# Patient Record
Sex: Female | Born: 1935
Health system: Southern US, Community
[De-identification: ages and names within clinical notes are randomized; demographics above are authoritative.]

## PROBLEM LIST (undated history)

## (undated) DIAGNOSIS — I639 Cerebral infarction, unspecified: Secondary | ICD-10-CM

## (undated) DIAGNOSIS — E039 Hypothyroidism, unspecified: Secondary | ICD-10-CM

## (undated) DIAGNOSIS — I4891 Unspecified atrial fibrillation: Secondary | ICD-10-CM

## (undated) DIAGNOSIS — I10 Essential (primary) hypertension: Secondary | ICD-10-CM

## (undated) HISTORY — DX: Cerebral infarction, unspecified: I63.9

## (undated) HISTORY — DX: Unspecified atrial fibrillation: I48.91

---

## 2000-12-18 ENCOUNTER — Ambulatory Visit (HOSPITAL_COMMUNITY): Admission: RE | Admit: 2000-12-18 | Discharge: 2000-12-18 | Payer: Self-pay | Admitting: Family Medicine

## 2000-12-18 ENCOUNTER — Encounter: Payer: Self-pay | Admitting: Family Medicine

## 2001-01-02 ENCOUNTER — Other Ambulatory Visit: Admission: RE | Admit: 2001-01-02 | Discharge: 2001-01-02 | Payer: Self-pay | Admitting: Family Medicine

## 2001-01-02 ENCOUNTER — Encounter: Payer: Self-pay | Admitting: Family Medicine

## 2001-01-02 ENCOUNTER — Ambulatory Visit (HOSPITAL_COMMUNITY): Admission: RE | Admit: 2001-01-02 | Discharge: 2001-01-02 | Payer: Self-pay | Admitting: Family Medicine

## 2001-08-13 ENCOUNTER — Ambulatory Visit (HOSPITAL_COMMUNITY): Admission: RE | Admit: 2001-08-13 | Discharge: 2001-08-13 | Payer: Self-pay | Admitting: Family Medicine

## 2001-08-13 ENCOUNTER — Encounter: Payer: Self-pay | Admitting: Family Medicine

## 2001-12-18 ENCOUNTER — Ambulatory Visit (HOSPITAL_COMMUNITY): Admission: RE | Admit: 2001-12-18 | Discharge: 2001-12-18 | Payer: Self-pay | Admitting: Family Medicine

## 2001-12-18 ENCOUNTER — Encounter: Payer: Self-pay | Admitting: Family Medicine

## 2002-02-20 ENCOUNTER — Encounter: Payer: Self-pay | Admitting: Gastroenterology

## 2002-02-20 ENCOUNTER — Ambulatory Visit (HOSPITAL_COMMUNITY): Admission: RE | Admit: 2002-02-20 | Discharge: 2002-02-20 | Payer: Self-pay | Admitting: Gastroenterology

## 2002-12-20 ENCOUNTER — Encounter: Payer: Self-pay | Admitting: Family Medicine

## 2002-12-20 ENCOUNTER — Ambulatory Visit (HOSPITAL_COMMUNITY): Admission: RE | Admit: 2002-12-20 | Discharge: 2002-12-20 | Payer: Self-pay | Admitting: Family Medicine

## 2003-01-24 ENCOUNTER — Ambulatory Visit (HOSPITAL_COMMUNITY): Admission: RE | Admit: 2003-01-24 | Discharge: 2003-01-24 | Payer: Self-pay | Admitting: Family Medicine

## 2003-01-24 ENCOUNTER — Encounter: Payer: Self-pay | Admitting: Family Medicine

## 2004-01-12 ENCOUNTER — Ambulatory Visit (HOSPITAL_COMMUNITY): Admission: RE | Admit: 2004-01-12 | Discharge: 2004-01-12 | Payer: Self-pay | Admitting: Family Medicine

## 2004-02-13 ENCOUNTER — Ambulatory Visit (HOSPITAL_COMMUNITY): Admission: RE | Admit: 2004-02-13 | Discharge: 2004-02-13 | Payer: Self-pay | Admitting: Family Medicine

## 2005-01-14 ENCOUNTER — Ambulatory Visit (HOSPITAL_COMMUNITY): Admission: RE | Admit: 2005-01-14 | Discharge: 2005-01-14 | Payer: Self-pay | Admitting: Family Medicine

## 2005-02-15 ENCOUNTER — Ambulatory Visit (HOSPITAL_COMMUNITY): Admission: RE | Admit: 2005-02-15 | Discharge: 2005-02-15 | Payer: Self-pay | Admitting: Family Medicine

## 2005-04-13 ENCOUNTER — Emergency Department (HOSPITAL_COMMUNITY): Admission: EM | Admit: 2005-04-13 | Discharge: 2005-04-13 | Payer: Self-pay | Admitting: Emergency Medicine

## 2006-01-16 ENCOUNTER — Ambulatory Visit (HOSPITAL_COMMUNITY): Admission: RE | Admit: 2006-01-16 | Discharge: 2006-01-16 | Payer: Self-pay | Admitting: Family Medicine

## 2006-04-14 ENCOUNTER — Ambulatory Visit (HOSPITAL_COMMUNITY): Admission: RE | Admit: 2006-04-14 | Discharge: 2006-04-14 | Payer: Self-pay | Admitting: Family Medicine

## 2006-04-21 ENCOUNTER — Ambulatory Visit (HOSPITAL_COMMUNITY): Admission: RE | Admit: 2006-04-21 | Discharge: 2006-04-21 | Payer: Self-pay | Admitting: Family Medicine

## 2006-05-22 ENCOUNTER — Ambulatory Visit (HOSPITAL_COMMUNITY): Admission: RE | Admit: 2006-05-22 | Discharge: 2006-05-22 | Payer: Self-pay | Admitting: Family Medicine

## 2006-06-01 ENCOUNTER — Emergency Department (HOSPITAL_COMMUNITY): Admission: EM | Admit: 2006-06-01 | Discharge: 2006-06-02 | Payer: Self-pay | Admitting: Emergency Medicine

## 2006-06-02 ENCOUNTER — Ambulatory Visit (HOSPITAL_COMMUNITY): Admission: RE | Admit: 2006-06-02 | Discharge: 2006-06-02 | Payer: Self-pay | Admitting: Emergency Medicine

## 2006-06-02 ENCOUNTER — Ambulatory Visit (HOSPITAL_COMMUNITY): Admission: RE | Admit: 2006-06-02 | Discharge: 2006-06-02 | Payer: Self-pay | Admitting: Family Medicine

## 2006-06-05 ENCOUNTER — Ambulatory Visit: Payer: Self-pay | Admitting: Orthopedic Surgery

## 2006-06-05 ENCOUNTER — Ambulatory Visit: Payer: Self-pay | Admitting: Urgent Care

## 2006-06-06 ENCOUNTER — Emergency Department (HOSPITAL_COMMUNITY): Admission: EM | Admit: 2006-06-06 | Discharge: 2006-06-07 | Payer: Self-pay | Admitting: Emergency Medicine

## 2006-06-07 ENCOUNTER — Ambulatory Visit (HOSPITAL_COMMUNITY): Admission: RE | Admit: 2006-06-07 | Discharge: 2006-06-07 | Payer: Self-pay | Admitting: Internal Medicine

## 2006-06-07 ENCOUNTER — Ambulatory Visit: Payer: Self-pay | Admitting: Gastroenterology

## 2006-06-07 ENCOUNTER — Inpatient Hospital Stay (HOSPITAL_COMMUNITY): Admission: AD | Admit: 2006-06-07 | Discharge: 2006-06-11 | Payer: Self-pay | Admitting: Internal Medicine

## 2006-07-03 ENCOUNTER — Ambulatory Visit: Payer: Self-pay | Admitting: Orthopedic Surgery

## 2006-09-22 ENCOUNTER — Emergency Department (HOSPITAL_COMMUNITY): Admission: EM | Admit: 2006-09-22 | Discharge: 2006-09-22 | Payer: Self-pay | Admitting: Emergency Medicine

## 2007-01-29 ENCOUNTER — Ambulatory Visit (HOSPITAL_COMMUNITY): Admission: RE | Admit: 2007-01-29 | Discharge: 2007-01-29 | Payer: Self-pay | Admitting: Family Medicine

## 2007-04-09 ENCOUNTER — Ambulatory Visit (HOSPITAL_COMMUNITY): Admission: RE | Admit: 2007-04-09 | Discharge: 2007-04-09 | Payer: Self-pay | Admitting: Family Medicine

## 2007-11-05 IMAGING — CT CT ABDOMEN W/ CM
2 of 5 series · 16 of 46 positions shown, 18 images · IV contrast (APPLIED)
Comparison: 06/02/2006

CLINICAL DATA: Perforated peptic ulcer disease

ABDOMEN CT WITH CONTRAST
TECHNIQUE: Multidetector CT imaging of the abdomen was performed following the
standard protocol during bolus administration of intravenous contrast.
Contrast:  100 cc Omnipaque 300

[Series 2: abd_pel 5.0 b40f · axial · 0.69mm/px · z∈[-290,-26]mm · 13 of 63 slices shown, 15 images]
[im 5/63  soft-tissue]
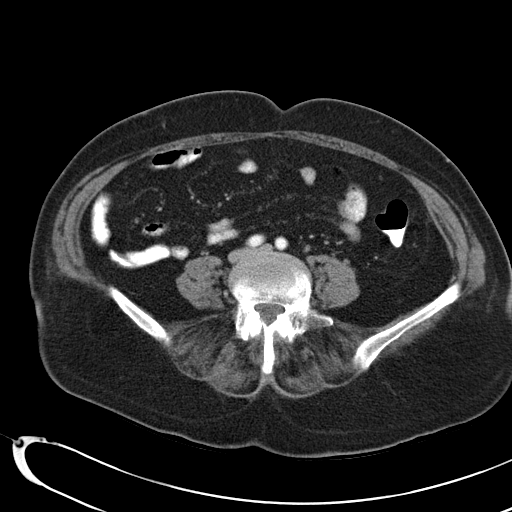
[im 5/63  bone]
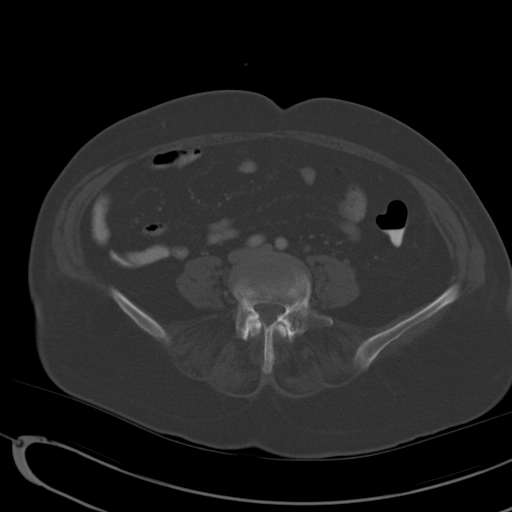
[im 9/63  soft-tissue]
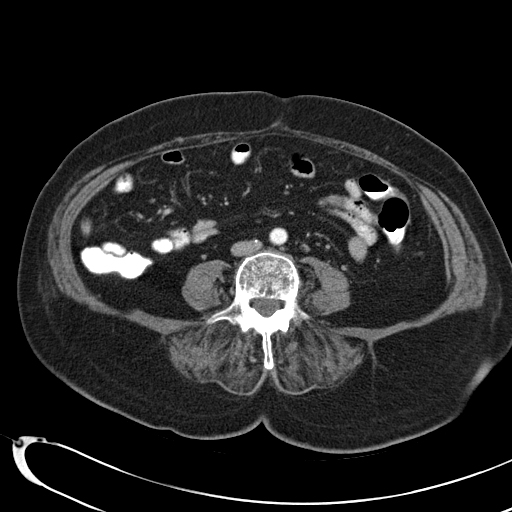
[im 14/63  soft-tissue]
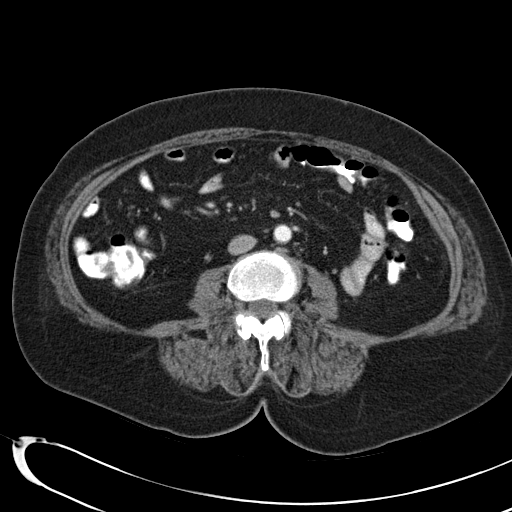
[im 18/63  soft-tissue]
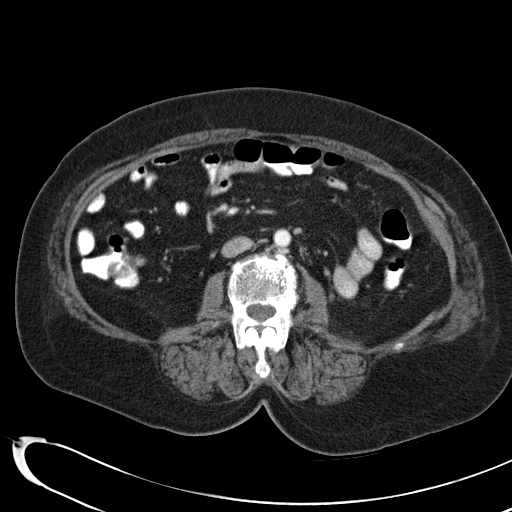
[im 23/63  soft-tissue]
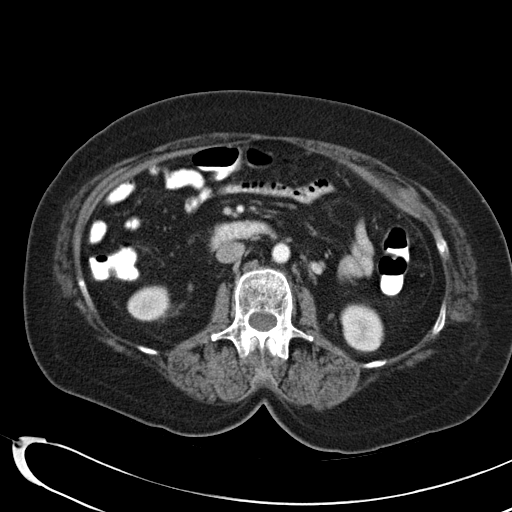
[im 27/63  soft-tissue]
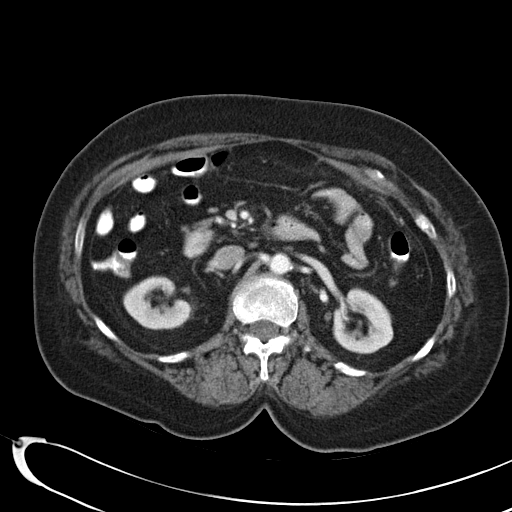
[im 32/63  soft-tissue]
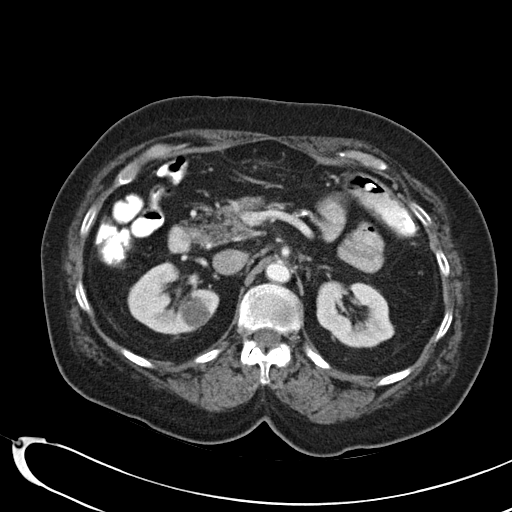
[im 36/63  soft-tissue]
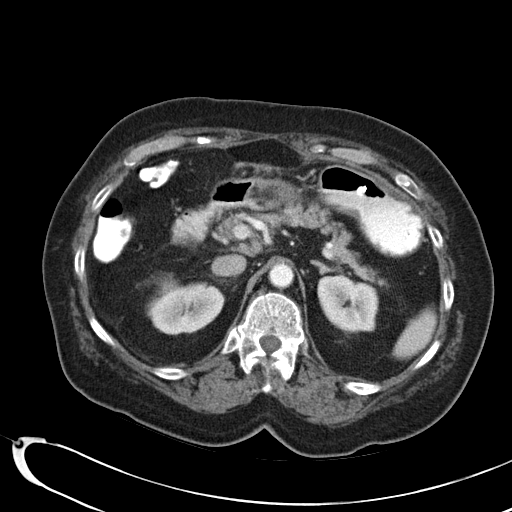
[im 40/63  soft-tissue]
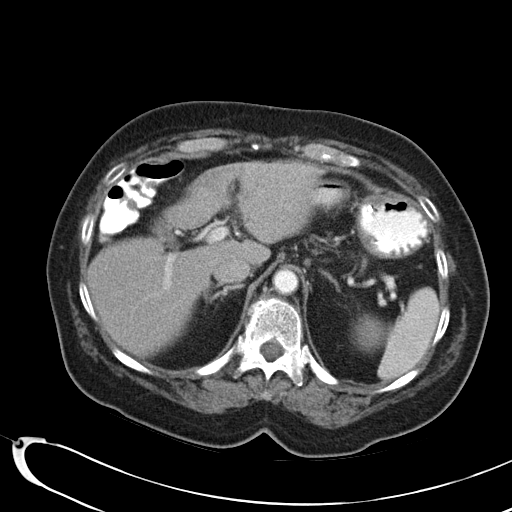
[im 40/63  bone]
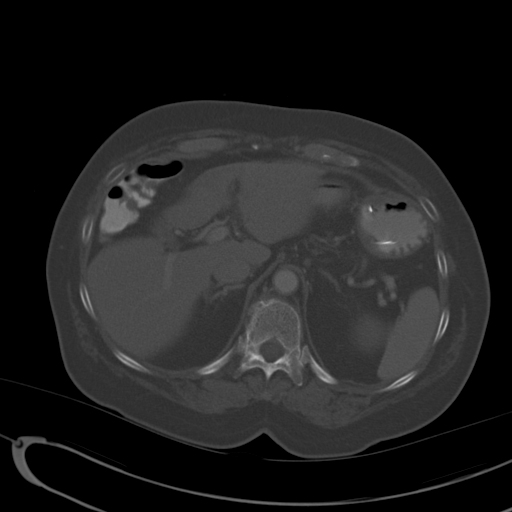
[im 45/63  soft-tissue]
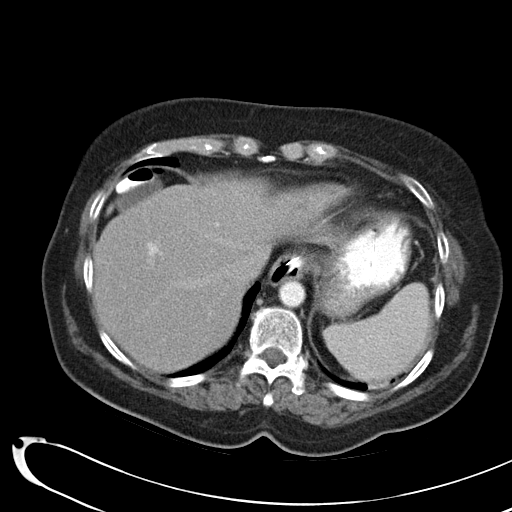
[im 49/63  soft-tissue]
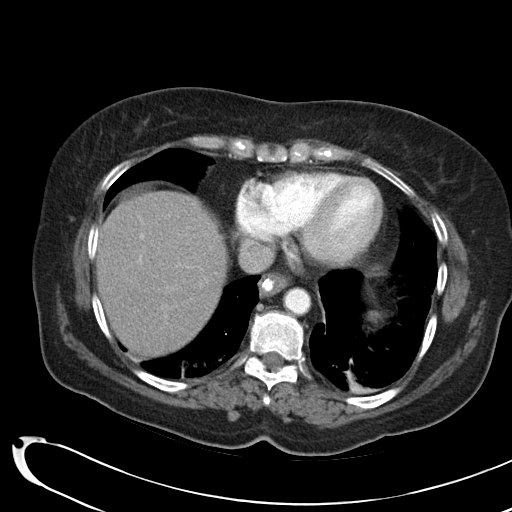
[im 54/63  soft-tissue]
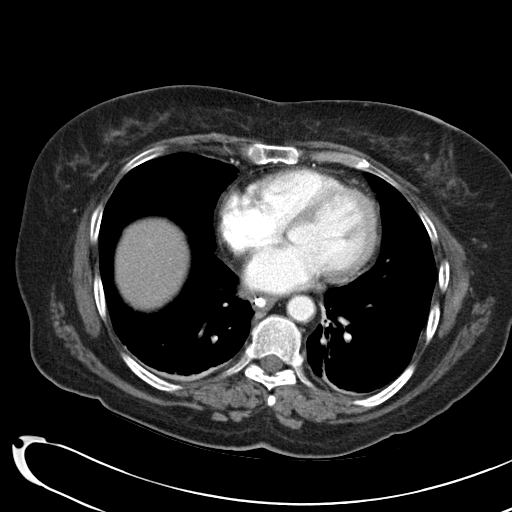
[im 58/63  soft-tissue]
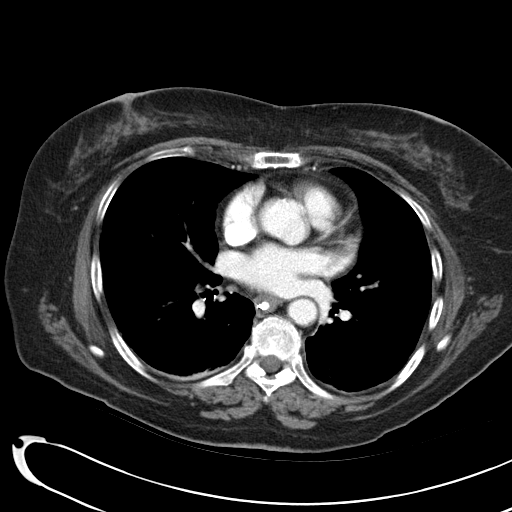

[Series 4: mpr coronal a/p · coronal · 0.62mm/px · 3 of 59 slices shown]
[im 20/59  soft-tissue]
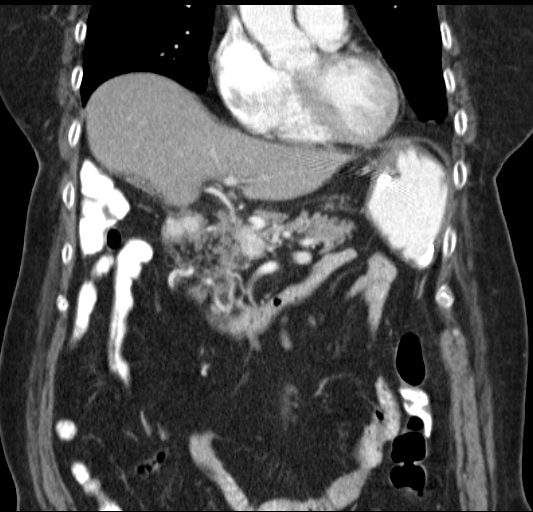
[im 26/59  soft-tissue]
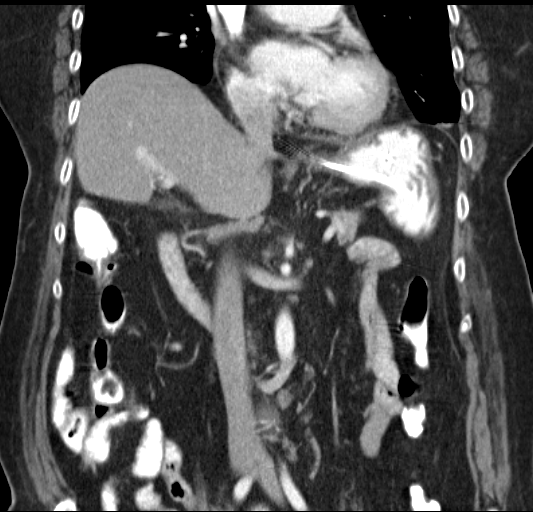
[im 33/59  soft-tissue]
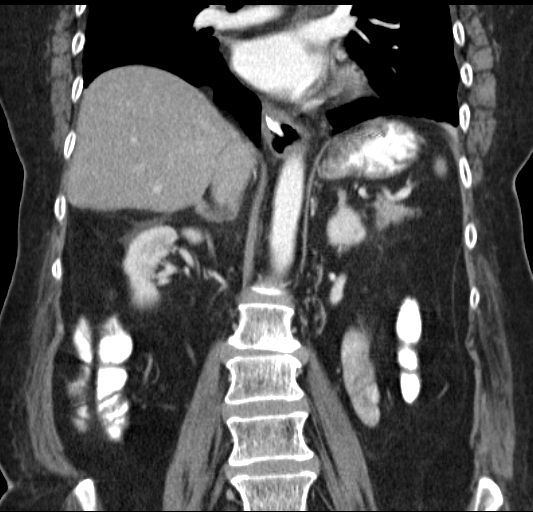

[16 of 46 positions shown; findings below may reference images not displayed]

FINDINGS: Previously noted free air in the abdomen has resolved. Continued
inflammatory change around the distal stomach. No contrast extravasation.

Liver, spleen, pancreas, adrenals unremarkable. Stable cysts within the kidneys.
Gallbladder grossly unremarkable.

There are bibasilar opacities compatible with atelectasis.

IMPRESSION

Resolution of previously noted free air. Continued stranding around the distal
stomach, possibly related to patient's peptic ulcer disease. No contrast
extravasation.

Bibasilar atelectasis.

## 2007-11-29 ENCOUNTER — Ambulatory Visit (HOSPITAL_COMMUNITY): Admission: RE | Admit: 2007-11-29 | Discharge: 2007-11-29 | Payer: Self-pay | Admitting: Family Medicine

## 2007-11-29 ENCOUNTER — Encounter: Payer: Self-pay | Admitting: Cardiology

## 2007-12-10 ENCOUNTER — Ambulatory Visit: Payer: Self-pay | Admitting: Cardiology

## 2007-12-14 ENCOUNTER — Ambulatory Visit (HOSPITAL_COMMUNITY): Admission: RE | Admit: 2007-12-14 | Discharge: 2007-12-14 | Payer: Self-pay | Admitting: Cardiology

## 2007-12-14 ENCOUNTER — Encounter: Payer: Self-pay | Admitting: Cardiology

## 2007-12-14 ENCOUNTER — Ambulatory Visit: Payer: Self-pay | Admitting: Cardiology

## 2008-01-31 ENCOUNTER — Ambulatory Visit (HOSPITAL_COMMUNITY): Admission: RE | Admit: 2008-01-31 | Discharge: 2008-01-31 | Payer: Self-pay | Admitting: Family Medicine

## 2008-07-28 ENCOUNTER — Emergency Department (HOSPITAL_COMMUNITY): Admission: EM | Admit: 2008-07-28 | Discharge: 2008-07-28 | Payer: Self-pay | Admitting: Emergency Medicine

## 2008-12-02 DIAGNOSIS — E039 Hypothyroidism, unspecified: Secondary | ICD-10-CM | POA: Insufficient documentation

## 2008-12-02 DIAGNOSIS — I10 Essential (primary) hypertension: Secondary | ICD-10-CM | POA: Insufficient documentation

## 2008-12-02 DIAGNOSIS — R9431 Abnormal electrocardiogram [ECG] [EKG]: Secondary | ICD-10-CM | POA: Insufficient documentation

## 2008-12-02 DIAGNOSIS — F411 Generalized anxiety disorder: Secondary | ICD-10-CM | POA: Insufficient documentation

## 2009-02-16 ENCOUNTER — Ambulatory Visit (HOSPITAL_COMMUNITY): Admission: RE | Admit: 2009-02-16 | Discharge: 2009-02-16 | Payer: Self-pay | Admitting: Family Medicine

## 2009-08-17 ENCOUNTER — Encounter: Payer: Self-pay | Admitting: Orthopedic Surgery

## 2009-08-17 ENCOUNTER — Ambulatory Visit (HOSPITAL_COMMUNITY): Admission: RE | Admit: 2009-08-17 | Discharge: 2009-08-17 | Payer: Self-pay | Admitting: Family Medicine

## 2009-09-28 ENCOUNTER — Ambulatory Visit: Payer: Self-pay | Admitting: Orthopedic Surgery

## 2009-09-28 DIAGNOSIS — M5430 Sciatica, unspecified side: Secondary | ICD-10-CM | POA: Insufficient documentation

## 2009-09-29 ENCOUNTER — Encounter: Payer: Self-pay | Admitting: Orthopedic Surgery

## 2009-09-30 ENCOUNTER — Ambulatory Visit (HOSPITAL_COMMUNITY): Admission: RE | Admit: 2009-09-30 | Discharge: 2009-09-30 | Payer: Self-pay | Admitting: Orthopedic Surgery

## 2009-10-07 ENCOUNTER — Ambulatory Visit: Payer: Self-pay | Admitting: Orthopedic Surgery

## 2009-10-07 DIAGNOSIS — M48061 Spinal stenosis, lumbar region without neurogenic claudication: Secondary | ICD-10-CM | POA: Insufficient documentation

## 2009-11-17 ENCOUNTER — Telehealth: Payer: Self-pay | Admitting: Orthopedic Surgery

## 2010-02-03 ENCOUNTER — Emergency Department (HOSPITAL_COMMUNITY): Admission: EM | Admit: 2010-02-03 | Discharge: 2010-02-03 | Payer: Self-pay | Admitting: Emergency Medicine

## 2010-02-25 ENCOUNTER — Ambulatory Visit (HOSPITAL_COMMUNITY): Admission: RE | Admit: 2010-02-25 | Discharge: 2010-02-25 | Payer: Self-pay | Admitting: Family Medicine

## 2010-04-06 ENCOUNTER — Encounter: Payer: Self-pay | Admitting: Orthopedic Surgery

## 2010-06-28 ENCOUNTER — Encounter: Payer: Self-pay | Admitting: Family Medicine

## 2010-07-06 NOTE — Letter (Signed)
Summary: *Orthopedic Consult Note  [facility]  [address]  [place], [place] [address]   Phone: [phone]  Fax: [phone]    Re:    Becky Johnson DOB:    29-Aug-1927   Dear: Chrissie Noa   Thank you for requesting that we see the above patient for consultation.  A copy of the detailed office note will be sent under separate cover, for your review.  Evaluation today is consistent with:  1)  SCIATICA (ICD-724.3)   Our recommendation is for: MRI.  She did not want any pain medicine       Thank you for this opportunity to look after your patient.  Sincerely,   Terrance Mass. MD.

## 2010-07-06 NOTE — Letter (Signed)
Summary: History form  History form   Imported By: Jacklynn Ganong 09/29/2009 10:40:23  _____________________________________________________________________  External Attachment:    Type:   Image     Comment:   External Document

## 2010-07-06 NOTE — Progress Notes (Signed)
Summary: Office Visit  Office Visit   Imported By: Jacklynn Ganong 09/24/2009 14:57:46  _____________________________________________________________________  External Attachment:    Type:   Image     Comment:   External Document

## 2010-07-06 NOTE — Progress Notes (Signed)
Summary: Referral to the Neurosurgeon.  Phone Note Outgoing Call   Call placed by: Waldon Reining,  November 17, 2009 8:31 AM Call placed to: Specialist Action Taken: Information Sent Summary of Call: I faxed a referral for this paient on 10-13-09 to [facility] to be seen for spinal stenosis. This was the 3rd referral for the patient because this was the only place that would except her insurance, [employer]. I informed the patient and she said that she did not know if she wanted to go here or not.

## 2010-07-06 NOTE — Progress Notes (Signed)
Summary: Office Visit  Office Visit   Imported By: Jacklynn Ganong 09/24/2009 14:59:21  _____________________________________________________________________  External Attachment:    Type:   Image     Comment:   External Document

## 2010-07-06 NOTE — Assessment & Plan Note (Signed)
Summary: NEW PROBLEM/BACK PAIN/XR [facility]/REF [facility]/CAF   Visit Type:  Initial Consult Referring Provider:  Dr. Regino Schultze  CC:  low back pain.  History of Present Illness: 77 years old female referred for back pain and leg pain on the RIGHT side.  Location RIGHT hip and leg.  Injury none.  Quality sharp dull stabbing.  Pain radiates to the RIGHT mid shin.  Initially had grade 9 pain now as a grade 5.  Pain comes and goes.  Pain came on suddenly.  She takes he quite Tylenol arthritis for pain is worse when she walks she has numbness and tingling.  Other treatments included Medrol Dosepak and Robaxin.    Xrays at [facility] on 08-17-09. degenerative scoliosis seen on x-ray and report same  Allergies: 1)  ! * Diclofenac  Review of Systems Constitutional:  Complains of fever, chills, and fatigue; denies weight loss and weight gain. Cardiovascular:  Denies chest pain, palpitations, fainting, and murmurs. Respiratory:  Complains of short of breath; denies wheezing, couch, tightness, pain on inspiration, and snoring . Gastrointestinal:  Denies heartburn, nausea, vomiting, diarrhea, constipation, and blood in your stools. Genitourinary:  Denies frequency, urgency, difficulty urinating, painful urination, flank pain, and bleeding in urine. Neurologic:  Complains of numbness, tingling, and unsteady gait; denies dizziness, tremors, and seizure. Musculoskeletal:  Complains of joint pain, instability, and muscle pain; denies swelling, stiffness, redness, and heat. Endocrine:  Denies excessive thirst, exessive urination, and heat or cold intolerance. Psychiatric:  Denies nervousness, depression, anxiety, and hallucinations. Skin:  Denies changes in the skin, poor healing, rash, itching, and redness. HEENT:  Denies blurred or double vision, eye pain, redness, and watering. Immunology:  Denies seasonal allergies, sinus problems, and allergic to bee stings. Hemoatologic:  Denies easy bleeding and  brusing; easy bruising.  Physical Exam  Additional Exam:  GEN: well developed, well nourished, normal grooming and hygiene, no deformity and normal body habitus.   CDV: pulses are normal, no edema, no erythema. no tenderness  Lymph: normal lymph nodes   Skin: no rashes, skin lesions or open sores   NEURO: normal coordination, reflexes, sensation.   Psyche: awake, alert and oriented. Mood normal   Gait: abnormal has to use a cane to walk  Inspection  chest tenderness in the lower back and RIGHT gluteal region.  She has a positive RIGHT straight leg raise.  Her gradeHer motor strength is 5 in the lower extremities.  Both knees hips and ankles are stable.  She has full range of motion hip knee and ankle.     Impression & Recommendations:  Problem # 1:  SCIATICA (ICD-724.3) Assessment New  Orders: New Patient Level III ([address])  Patient Instructions: 1)  MRI L-SPINE return for results

## 2010-07-06 NOTE — Letter (Signed)
Summary: [facility] medical record request  [facility] medical record request   Imported By: Jacklynn Ganong 05/06/2010 13:55:21  _____________________________________________________________________  External Attachment:    Type:   Image     Comment:   External Document

## 2010-07-06 NOTE — Miscellaneous (Signed)
Summary: mri l spine [facility] 09/30/09 reg 430pm  Clinical Lists Changes  alled and lmom for patient to call and confirm mri appt, she needs a fu appt here in the office for results, [employer] precert [id number] expires 10/28/09. Patient returned call. Fol/up appointment has been scheduled.

## 2010-07-06 NOTE — Assessment & Plan Note (Signed)
Summary: REVIEW MRI RESULTS/[facility]F   Visit Type:  Follow-up Referring Provider:  Dr. Regino Schultze  CC:  Back pain.  History of Present Illness:  followup visit.  Previous history:  77 years old female referred for back pain and leg pain on the RIGHT side.  Location RIGHT hip and leg.  Injury none.  Quality sharp dull stabbing.  Pain radiates to the RIGHT mid shin.  Initially had grade 9 pain now as a grade 5.  Pain comes and goes.  Pain came on suddenly.  She takes he quite Tylenol arthritis for pain is worse when she walks she has numbness and tingling.  Other treatments included Medrol Dosepak and Robaxin.  Tylenol arthritis for pain, has had Robaxin and Dose pack in the past.  MRI on April 27  Xrays at [facility] on 08-17-09. degenerative scoliosis seen on x-ray and report same  Allergies: 1)  ! * Diclofenac   Impression & Recommendations:  Problem # 1:  SPINAL STENOSIS, LUMBAR (ICD-724.02)  IMPRESSION:   1.  Advanced degenerative disc disease at L2-3 in combination with short pedicles and facet disease creates moderate spinal and bilateral lateral recess stenosis, left greater than right. 2.  Multifactorial moderately severe spinal and bilateral lateral recess stenosis at L4-5.  Mild foraminal stenosis bilaterally, left greater than right. 3.  Mild spinal lateral recess stenosis at L5-S1.  There is also a broad-based right lateral foraminal/extraforaminal disc protrusion which appears to contact the right L5 nerve root.   Definite severe spinal stenosis.  I will go ahead and start getting her epidurals.  But she will need a neurosurgical consult as well.   Orders: Misc. Referral (Misc. Ref) Neurosurgeon Referral (Neurosurgeon) Est. Patient Level II [id number])  Patient Instructions: 1)  refer to neuro surgery  2)  and for L4-5 ESI  3)  no f/u planned

## 2010-08-20 LAB — URINE CULTURE: Colony Count: 15000

## 2010-08-20 LAB — BASIC METABOLIC PANEL
GFR calc non Af Amer: 55 mL/min — ABNORMAL LOW (ref >60)
Potassium: 3.6 mEq/L (ref 3.5–5.1)
Sodium: 137 mEq/L (ref 135–145)

## 2010-08-20 LAB — CBC
HCT: 39.4 % (ref 36.0–46.0)
Hemoglobin: 12.9 g/dL (ref 12.0–15.0)
RDW: 17.4 % — ABNORMAL HIGH (ref 11.5–15.5)
WBC: 10.8 10*3/uL — ABNORMAL HIGH (ref 4.0–10.5)

## 2010-08-20 LAB — DIFFERENTIAL
Basophils Absolute: 0 10*3/uL (ref 0.0–0.1)
Lymphocytes Relative: 13 % (ref 12–46)
Monocytes Absolute: 0.6 10*3/uL (ref 0.1–1.0)
Monocytes Relative: 6 % (ref 3–12)
Neutro Abs: 8.7 10*3/uL — ABNORMAL HIGH (ref 1.7–7.7)

## 2010-08-20 LAB — URINALYSIS, ROUTINE W REFLEX MICROSCOPIC
Nitrite: NEGATIVE
Specific Gravity, Urine: <1.005 — ABNORMAL LOW (ref 1.005–1.030)
pH: 5.5 (ref 5.0–8.0)

## 2010-08-20 LAB — HEPATIC FUNCTION PANEL
ALT: 17 U/L (ref 0–35)
AST: 24 U/L (ref 0–37)
Albumin: 3.7 g/dL (ref 3.5–5.2)
Bilirubin, Direct: 0.1 mg/dL (ref 0.0–0.3)

## 2010-09-21 LAB — DIFFERENTIAL
Eosinophils Absolute: 0.1 10*3/uL (ref 0.0–0.7)
Eosinophils Relative: 1 % (ref 0–5)
Lymphocytes Relative: 10 % — ABNORMAL LOW (ref 12–46)
Lymphs Abs: 0.9 10*3/uL (ref 0.7–4.0)
Monocytes Absolute: 0.5 10*3/uL (ref 0.1–1.0)
Monocytes Relative: 6 % (ref 3–12)

## 2010-09-21 LAB — COMPREHENSIVE METABOLIC PANEL
ALT: 20 U/L (ref 0–35)
AST: 24 U/L (ref 0–37)
Albumin: 3.8 g/dL (ref 3.5–5.2)
CO2: 29 mEq/L (ref 19–32)
Calcium: 9.3 mg/dL (ref 8.4–10.5)
Creatinine, Ser: 1.03 mg/dL (ref 0.4–1.2)
GFR calc Af Amer: >60 mL/min (ref >60)
Sodium: 137 mEq/L (ref 135–145)
Total Protein: 7.2 g/dL (ref 6.0–8.3)

## 2010-09-21 LAB — CBC
MCHC: 34.1 g/dL (ref 30.0–36.0)
Platelets: 190 10*3/uL (ref 150–400)
RBC: 4.62 MIL/uL (ref 3.87–5.11)
RDW: 16.3 % — ABNORMAL HIGH (ref 11.5–15.5)

## 2010-10-19 NOTE — Letter (Signed)
December 10, 2007    Kirk Ruths, M.D.  [address]  [place], [place] [address]   RE:  MALAI, LADY  MRN:  [id number]  /  DOB:  1927-12-30   Dear Annette Stable:   It is my pleasure evaluating Ms. Salonga in the office today in  consultation at your request.  As you know, this nice woman's husband is  also a patient of mine.  She presents with EKG abnormalities in the  absence of symptoms.  Ms. Kimmey has enjoyed generally excellent  health.  She has only been hospitalized once since age 60, at which  time, she underwent an uncomplicated appendectomy.  She was in the  hospital for a few days in 2008 with chest discomfort that proved to be  due to peptic ulcer disease.  She has been asymptomatic since treatment  with omeprazole was initiated.  She underwent screening colonoscopy in  2003, which was apparently negative.  She has had mild hypertension  controlled with medical therapy.  She also has mild dyslipidemia, also  well controlled with medical therapy.   ALLERGIES:  She reports an allergy to NAPROXEN.   CURRENT MEDICATIONS:  1. Levothyroxine 0.088 mg daily.  2. Moexipril/hydrochlorothiazide 7.5/12.5 mg daily.  3. Simvastatin 20 mg daily.  4. Metoprolol ER 50 mg daily.  5. Calcium plus vitamin D.  6. Xanax 0.5 mg q. 8h.  7. Omeprazole 20 mg daily.  8. She also uses Provera p.r.n. and Tylenol.   SOCIAL HISTORY:  Retired; married.  She had only 1 daughter, who  unfortunately died due to leukemia approximately 6 months ago.  The  patient is tearful when she discusses this, but appears appropriate and  depressed.  Physical activity is generally limited to house work and  some walking.   FAMILY HISTORY:  Unknown.   REVIEW OF SYSTEMS:  Notable for bilateral cataract extractions with  continuing need for corrective lenses, arthritic discomfort in her  hands, neck, shoulders, and lower extremities and occasional mild  peripheral edema.  All other systems reviewed and are  negative.   PHYSICAL EXAMINATION:  GENERAL:  A pleasant woman in no acute distress.  VITAL SIGNS:  The weight is 153, blood pressure 140/75, heart rate 70  and regular, respirations 16.  HEENT:  Anicteric sclerae;  EOM is full.  Normal lids and conjunctivae.  Normal oral mucosa.  NECK:  No jugular venous distention; normal carotid upstrokes without  bruits.  ENDOCRINE:  No thyromegaly.  HEMATOPOIETIN:  No adenopathy.  LUNGS:  Clear.  CARDIAC:  Normal, but distant first and second heart sounds.  ABDOMEN:  Soft and nontender; no organomegaly.  EXTREMITIES:  Trace edema.  NEUROLOGIC:  Symmetric strength and tones;  normal cranial nerves.   EKG:  Normal sinus rhythm; moderately delayed R-wave progression;  minimal non-specific ST segment abnormality.   IMPRESSION:  Ms. Hulsebus has abnormal R-wave progression, which  probably relates to body habitus.  Nonetheless, the findings are  adequately impressive to warrant performing an echocardiogram.  This  study will be obtained at [facility] earliest convenience with the  results forwarded to your office.  If normal, as expected, I would not  pursue additional workup.  Blood pressure control is good.  A recent  lipid profile obtained from her medical records, shows elevated total  cholesterol with quite high HDL of 70 and a good LDL below 90.  Triglycerides were mildly elevated.  Current medical regime appears  appropriate.  I will plan to see  this nice woman again as needed.    Sincerely,      Gerrit Friends. Dietrich Pates, MD, [facility]  Electronically Signed    RMR/MedQ  DD: 12/10/2007  DT: 12/10/2007  Job #: [id number]

## 2010-10-22 NOTE — H&P (Signed)
[name], Johnson         ACCOUNT NO.:  [id number]   MEDICAL RECORD NO.:  [id number]          PATIENT TYPE:  INP   LOCATION:  A327                          FACILITY:  [facility]   PHYSICIAN:  Kassie Mends, M.D.      DATE OF BIRTH:  01-24-1928   DATE OF ADMISSION:  06/07/2006  DATE OF DISCHARGE:  LH                              HISTORY & PHYSICAL   ADMITTING DIAGNOSIS:  Perforated peptic ulcer disease.   HISTORY OF PRESENT ILLNESS:  Becky Johnson is a 77 year old Caucasian  female who was seen in our office 2 days ago as a work in.  At that time  she had 4-day history of severe upper abdominal pain associated with  nausea but no vomiting.  She had been on diclofenac for about 2 or 3  doses on May 22, 2006 but this has continued.  She underwent a CT  of the abdomen and pelvis on June 02, 2006 and was found to have a  small amount of intraperitoneal gas consistent with perforated viscous.  She had inflammatory edematous changes around the gastric antrum and  proximal duodenum suggestive of peptic ulcer disease as possible  etiology.  She also had abdominal ultrasound which was normal.  Her  white count at that point was 9800.  She was started on Prevacid 30 mg  b.i.d. as well as Augmentin 500 mg t.i.d.  She had been given a  prescription for Naprosyn but was not taking this.   She underwent upper GI series with Gastrografin which revealed deep  perforated ulcer in the distal antrum or pylorus.  There was edema in  the distal antrum.  Small amount of contrast was seen to extravasate  from the deep lesser curvature ulcer.  The patient actually went to the  emergency department last night with severe abdominal pain and was  discharged for her x-ray today.  The patient's case was discussed with  Dr. Jena Gauss by Nicholas Lose, Nurse Practitioner.  It was decided  that she needed to see Dr. Lovell Sheehan urgently.  He was contacted and it  was recommended at that point that the  patient be brought into the  hospital for further management.  The patient states her abdominal pain  has improved at this point.  Again, she has had no vomiting.  She had a  loose stool this morning after drinking contrast.  No melena or rectal  bleeding.  She has not had any fever or chills.   MEDICATIONS:  1. Levoxyl 88 mcg daily.  2. Moexipril/hydrochlorothiazide 15/25 mg half tablet daily.  3. Xanax 0.5 mg q.i.d. as needed but she has not taken any in weeks.  4. ProAir p.r.n.  5. Nasonex two sprays to each nostril daily p.r.n.  6. Multivitamin daily.  7. Calcium 500 mg daily.  8. Aspirin 81 mg daily.  9. Nasal saline spray p.r.n.  10.Tylenol p.r.n.  11.Allegra 180 mg daily.  12.She was given prescription for Naprosyn but she has not actually      taken any.  13.Vicodin 5/500 mg every 4 hours as needed.  14.Augmentin 500 mg t.i.d.  15.Prevacid 30 mg  Solu-Tabs b.i.d. recently started.  16.Recently had been on diclofenac for several doses on May 22, 2006.   ALLERGIES:  DICLOFENAC.   PAST MEDICAL HISTORY:  Hypothyroidism, hypertension, anxiety, seasonal  allergies, left foot with torn tendon followed by Dr. Romeo Apple,  appendectomy.   FAMILY HISTORY:  Negative for colorectal cancer, liver disease, chronic  GI illnesses.   SOCIAL HISTORY:  She is married, has one healthy daughter.  She is  retired.  Denies any alcohol or drug use.   REVIEW OF SYSTEMS:  GI:  See HPI.  CONSTITUTIONAL:  Weight has been  stable.  No fever or chills.  CARDIOVASCULAR:  No chest pain,  palpitations, shortness of breath.   PHYSICAL EXAMINATION:  VITAL SIGNS:  Weight is 74.4 kg, height 62  inches, temperature 97, pulse 70, respirations 20, blood pressure  142/66, O2 saturations 95% on room air.  GENERAL:  Pleasant elderly Caucasian female in no acute distress.SKIN:  Warm and dry, no jaundice.HEENT:  Sclerae are nonicteric.  Oropharyngeal  mucosa moist and pink.  No lesions, erythema, or  exudate.  NG tube in  place with a few specks of red blood in the tube.CHEST:  Lungs are clear  to auscultation.  Cardiac exam reveals regular rate and rhythm.  No  murmurs, rubs, or gallops.ABDOMEN:  Positive bowel sounds.  Abdomen  soft.  She has mild tenderness in epigastrium to deep palpation.  No  rebound tenderness or guarding.  No abdominal bruits or hernias, no  hepatosplenomegaly or masses.  EXTREMITIES:  No edema.   LABORATORIES:  Labs from yesterday revealed normal MET-7 except glucose  of 100, LFTs were normal, lipase normal, platelet count 262,000,  hemoglobin 14.3 and white count was 9800.  Her white count was 13,800  when this was checked on June 05, 2006.  Today's labs are pending.  Chest x-ray revealed NG tube in the stomach otherwise stable changes.   IMPRESSION:  Becky Johnson is a 77 year old lady with perforated peptic  ulcer disease (in the antrum or distal antrum or pylorus) on aspirin  daily for prophylaxis.  She also recently had been taking some  diclofenac although sounds like limited doses.  Surgery consultation has  been obtained.  The plan at this point is to continue nasogastric tube  intermittent suction, IV antibiotics and rescan her in a couple of days.   PLAN:  1. Unasyn 1.5 grams IV q.6 h.  2. P.r.n. IV Zofran and Dilaudid as needed.  3. Normal saline 75 mL per hour.  4. Follow up pending labs/PT/PTT in the morning.  Follow up H. pylori      serologies which are pending were done as an outpatient.  CT scan      abdomen with p.o. contrast on June 09, 2006.      Tana Coast, P.A.      Kassie Mends, M.D.  Electronically Signed    LL/MEDQ  D:  06/07/2006  T:  06/07/2006  Job:  [id number]   cc:   Kirk Ruths, M.D.  Fax: [phone]

## 2010-10-22 NOTE — Discharge Summary (Signed)
NAMECOVA, KNIERIEM NO.:  [id number]   MEDICAL RECORD NO.:  [id number]          PATIENT TYPE:  INP   LOCATION:  A327                          FACILITY:  [facility]   PHYSICIAN:  Dalia Heading, M.D.  DATE OF BIRTH:  [birth year]   DATE OF ADMISSION:  06/07/2006  DATE OF DISCHARGE:  01/06/2008LH                               DISCHARGE SUMMARY   HOSPITAL COURSE SUMMARY:  The patient is a 77 year old white female who  presented to [facility] after an upper GI series revealed an  ongoing peptic ulcer leak.  She was found to have a perforated viscus  with intraperitoneal gas on June 02, 2006.  She continued on a clear  liquid diet as an outpatient.  She underwent the upper GI series with  Gastrografin on June 07, 2006 and was found to have a small amount of  contrast extravasating from a lesser curvature ulcer.  The patient was  brought into the hospital for nasogastric tube decompression and bowel  rest.  She was also started on Unasyn.   During her stay, her white blood cell count remained normal.  She had no  signs of rigidity.  A followup CAT scan with oral contrast performed on  June 09, 2006 revealed no extravasation of contrast.  The nasogastric  tube was left in for another 24 hours and then removed.  Her diet was  advanced out difficulty.   The patient is being discharged home on June 11, 2006 in good and  improving condition.   DISCHARGE INSTRUCTIONS:  The patient is to follow up with Dr. Franky Macho on June 15, 2006.   DISCHARGE MEDICATIONS:  She is to resume all her medications as  previously prescribed including Prevacid 30 mg p.o. b.i.d.   PRINCIPAL DIAGNOSES:  1. Perforated peptic ulcer disease, resolved.  2. Hypothyroidism.  3. Extrinsic allergies.   PRINCIPAL PROCEDURE:  None.      Dalia Heading, M.D.  Electronically Signed     MAJ/MEDQ  D:  06/11/2006  T:  06/11/2006  Job:  [id number]   cc:   Kirk Ruths,  M.D.  Fax: [phone]   R. Roetta Sessions, M.D.  [address]  [place]  [place] [address]

## 2011-02-21 ENCOUNTER — Other Ambulatory Visit (HOSPITAL_COMMUNITY): Payer: Self-pay | Admitting: Family Medicine

## 2011-02-21 DIAGNOSIS — Z139 Encounter for screening, unspecified: Secondary | ICD-10-CM

## 2011-02-28 ENCOUNTER — Ambulatory Visit (HOSPITAL_COMMUNITY)
Admission: RE | Admit: 2011-02-28 | Discharge: 2011-02-28 | Disposition: A | Discharge status: Home / Self Care | Payer: Medicare Other | Source: Ambulatory Visit | Attending: Family Medicine | Admitting: Family Medicine

## 2011-02-28 DIAGNOSIS — Z139 Encounter for screening, unspecified: Secondary | ICD-10-CM

## 2011-02-28 DIAGNOSIS — Z1231 Encounter for screening mammogram for malignant neoplasm of breast: Secondary | ICD-10-CM | POA: Insufficient documentation

## 2011-04-07 ENCOUNTER — Other Ambulatory Visit (HOSPITAL_COMMUNITY): Payer: Self-pay | Admitting: Physician Assistant

## 2011-04-07 DIAGNOSIS — Z01419 Encounter for gynecological examination (general) (routine) without abnormal findings: Secondary | ICD-10-CM

## 2011-04-07 DIAGNOSIS — Z139 Encounter for screening, unspecified: Secondary | ICD-10-CM

## 2011-04-12 ENCOUNTER — Ambulatory Visit (HOSPITAL_COMMUNITY)
Admission: RE | Admit: 2011-04-12 | Discharge: 2011-04-12 | Disposition: A | Discharge status: Home / Self Care | Payer: Medicare Other | Source: Ambulatory Visit | Attending: Physician Assistant | Admitting: Physician Assistant

## 2011-04-12 DIAGNOSIS — M818 Other osteoporosis without current pathological fracture: Secondary | ICD-10-CM | POA: Insufficient documentation

## 2011-04-12 DIAGNOSIS — Z139 Encounter for screening, unspecified: Secondary | ICD-10-CM

## 2011-04-12 DIAGNOSIS — Z01419 Encounter for gynecological examination (general) (routine) without abnormal findings: Secondary | ICD-10-CM

## 2011-04-12 DIAGNOSIS — Z13820 Encounter for screening for osteoporosis: Secondary | ICD-10-CM | POA: Insufficient documentation

## 2012-02-22 ENCOUNTER — Other Ambulatory Visit (HOSPITAL_COMMUNITY): Payer: Self-pay | Admitting: Family Medicine

## 2012-02-22 DIAGNOSIS — Z139 Encounter for screening, unspecified: Secondary | ICD-10-CM

## 2012-03-05 ENCOUNTER — Ambulatory Visit (HOSPITAL_COMMUNITY)
Admission: RE | Admit: 2012-03-05 | Discharge: 2012-03-05 | Disposition: A | Discharge status: Home / Self Care | Payer: Medicare Other | Source: Ambulatory Visit | Attending: Family Medicine | Admitting: Family Medicine

## 2012-03-05 DIAGNOSIS — Z139 Encounter for screening, unspecified: Secondary | ICD-10-CM

## 2012-03-05 DIAGNOSIS — Z1231 Encounter for screening mammogram for malignant neoplasm of breast: Secondary | ICD-10-CM | POA: Insufficient documentation

## 2012-12-14 ENCOUNTER — Other Ambulatory Visit (HOSPITAL_COMMUNITY): Payer: Self-pay | Admitting: Family Medicine

## 2012-12-14 DIAGNOSIS — M549 Dorsalgia, unspecified: Secondary | ICD-10-CM

## 2012-12-14 DIAGNOSIS — E039 Hypothyroidism, unspecified: Secondary | ICD-10-CM

## 2012-12-14 DIAGNOSIS — E785 Hyperlipidemia, unspecified: Secondary | ICD-10-CM

## 2012-12-14 DIAGNOSIS — I10 Essential (primary) hypertension: Secondary | ICD-10-CM

## 2012-12-17 ENCOUNTER — Ambulatory Visit (HOSPITAL_COMMUNITY)
Admission: RE | Admit: 2012-12-17 | Discharge: 2012-12-17 | Disposition: A | Discharge status: Home / Self Care | Payer: Medicare Other | Source: Ambulatory Visit | Attending: Family Medicine | Admitting: Family Medicine

## 2012-12-17 DIAGNOSIS — M899 Disorder of bone, unspecified: Secondary | ICD-10-CM | POA: Insufficient documentation

## 2012-12-17 DIAGNOSIS — Z780 Asymptomatic menopausal state: Secondary | ICD-10-CM | POA: Insufficient documentation

## 2012-12-17 DIAGNOSIS — E785 Hyperlipidemia, unspecified: Secondary | ICD-10-CM

## 2012-12-17 DIAGNOSIS — E039 Hypothyroidism, unspecified: Secondary | ICD-10-CM

## 2012-12-17 DIAGNOSIS — I10 Essential (primary) hypertension: Secondary | ICD-10-CM

## 2012-12-17 DIAGNOSIS — M549 Dorsalgia, unspecified: Secondary | ICD-10-CM

## 2013-02-18 ENCOUNTER — Other Ambulatory Visit (HOSPITAL_COMMUNITY): Payer: Self-pay | Admitting: Family Medicine

## 2013-02-18 DIAGNOSIS — Z139 Encounter for screening, unspecified: Secondary | ICD-10-CM

## 2019-03-25 ENCOUNTER — Inpatient Hospital Stay (HOSPITAL_COMMUNITY)
Admission: EM | Admit: 2019-03-25 | Discharge: 2019-03-29 | DRG: 064 | Disposition: A | Discharge status: Skilled Nursing Facility | Payer: Medicare Other | Attending: Internal Medicine | Admitting: Internal Medicine

## 2019-03-25 ENCOUNTER — Emergency Department (HOSPITAL_COMMUNITY): Payer: Medicare Other

## 2019-03-25 DIAGNOSIS — I6340 Cerebral infarction due to embolism of unspecified cerebral artery: Secondary | ICD-10-CM | POA: Diagnosis not present

## 2019-03-25 DIAGNOSIS — I639 Cerebral infarction, unspecified: Secondary | ICD-10-CM | POA: Diagnosis not present

## 2019-03-25 DIAGNOSIS — E039 Hypothyroidism, unspecified: Secondary | ICD-10-CM | POA: Diagnosis present

## 2019-03-25 DIAGNOSIS — G8194 Hemiplegia, unspecified affecting left nondominant side: Secondary | ICD-10-CM | POA: Diagnosis present

## 2019-03-25 DIAGNOSIS — Z79899 Other long term (current) drug therapy: Secondary | ICD-10-CM

## 2019-03-25 DIAGNOSIS — R29810 Facial weakness: Secondary | ICD-10-CM | POA: Diagnosis present

## 2019-03-25 DIAGNOSIS — I110 Hypertensive heart disease with heart failure: Secondary | ICD-10-CM | POA: Diagnosis present

## 2019-03-25 DIAGNOSIS — E876 Hypokalemia: Secondary | ICD-10-CM | POA: Diagnosis present

## 2019-03-25 DIAGNOSIS — J9601 Acute respiratory failure with hypoxia: Secondary | ICD-10-CM

## 2019-03-25 DIAGNOSIS — R29703 NIHSS score 3: Secondary | ICD-10-CM | POA: Diagnosis present

## 2019-03-25 DIAGNOSIS — R7302 Impaired glucose tolerance (oral): Secondary | ICD-10-CM | POA: Diagnosis present

## 2019-03-25 DIAGNOSIS — R1312 Dysphagia, oropharyngeal phase: Secondary | ICD-10-CM | POA: Diagnosis present

## 2019-03-25 DIAGNOSIS — Z8673 Personal history of transient ischemic attack (TIA), and cerebral infarction without residual deficits: Secondary | ICD-10-CM

## 2019-03-25 DIAGNOSIS — Z8249 Family history of ischemic heart disease and other diseases of the circulatory system: Secondary | ICD-10-CM

## 2019-03-25 DIAGNOSIS — E785 Hyperlipidemia, unspecified: Secondary | ICD-10-CM | POA: Diagnosis present

## 2019-03-25 DIAGNOSIS — R471 Dysarthria and anarthria: Secondary | ICD-10-CM | POA: Diagnosis present

## 2019-03-25 DIAGNOSIS — Z79890 Hormone replacement therapy (postmenopausal): Secondary | ICD-10-CM

## 2019-03-25 DIAGNOSIS — Z20828 Contact with and (suspected) exposure to other viral communicable diseases: Secondary | ICD-10-CM | POA: Diagnosis present

## 2019-03-25 DIAGNOSIS — F419 Anxiety disorder, unspecified: Secondary | ICD-10-CM | POA: Diagnosis present

## 2019-03-25 DIAGNOSIS — I5031 Acute diastolic (congestive) heart failure: Secondary | ICD-10-CM | POA: Diagnosis present

## 2019-03-25 DIAGNOSIS — I4891 Unspecified atrial fibrillation: Secondary | ICD-10-CM

## 2019-03-25 HISTORY — DX: Essential (primary) hypertension: I10

## 2019-03-25 HISTORY — DX: Hypothyroidism, unspecified: E03.9

## 2019-03-25 LAB — CBC
HCT: 37.6 % (ref 36.0–46.0)
Hemoglobin: 11.4 g/dL — ABNORMAL LOW (ref 12.0–15.0)
MCH: 27.3 pg (ref 26.0–34.0)
MCHC: 30.3 g/dL (ref 30.0–36.0)
MCV: 90.2 fL (ref 80.0–100.0)
Platelets: 241 10*3/uL (ref 150–400)
RBC: 4.17 MIL/uL (ref 3.87–5.11)
RDW: 16.7 % — ABNORMAL HIGH (ref 11.5–15.5)
WBC: 9.1 10*3/uL (ref 4.0–10.5)
nRBC: 0 % (ref 0.0–0.2)

## 2019-03-25 LAB — COMPREHENSIVE METABOLIC PANEL
ALT: 18 U/L (ref 0–44)
AST: 23 U/L (ref 15–41)
Albumin: 3.6 g/dL (ref 3.5–5.0)
Alkaline Phosphatase: 50 U/L (ref 38–126)
Anion gap: 12 (ref 5–15)
BUN: 21 mg/dL (ref 8–23)
CO2: 18 mmol/L — ABNORMAL LOW (ref 22–32)
Calcium: 8.5 mg/dL — ABNORMAL LOW (ref 8.9–10.3)
Chloride: 106 mmol/L (ref 98–111)
Creatinine, Ser: 1.19 mg/dL — ABNORMAL HIGH (ref 0.44–1.00)
GFR calc Af Amer: 47 mL/min — ABNORMAL LOW (ref >60)
GFR calc non Af Amer: 41 mL/min — ABNORMAL LOW (ref >60)
Glucose, Bld: 151 mg/dL — ABNORMAL HIGH (ref 70–99)
Potassium: 3.6 mmol/L (ref 3.5–5.1)
Sodium: 136 mmol/L (ref 135–145)
Total Bilirubin: 0.6 mg/dL (ref 0.3–1.2)
Total Protein: 7.1 g/dL (ref 6.5–8.1)

## 2019-03-25 LAB — CBG MONITORING, ED: Glucose-Capillary: 172 mg/dL — ABNORMAL HIGH (ref 70–99)

## 2019-03-25 LAB — DIFFERENTIAL
Abs Immature Granulocytes: 0.03 10*3/uL (ref 0.00–0.07)
Basophils Absolute: 0 10*3/uL (ref 0.0–0.1)
Basophils Relative: 0 %
Eosinophils Absolute: 0.1 10*3/uL (ref 0.0–0.5)
Eosinophils Relative: 1 %
Immature Granulocytes: 0 %
Lymphocytes Relative: 7 %
Lymphs Abs: 0.7 10*3/uL (ref 0.7–4.0)
Monocytes Absolute: 0.4 10*3/uL (ref 0.1–1.0)
Monocytes Relative: 5 %
Neutro Abs: 7.9 10*3/uL — ABNORMAL HIGH (ref 1.7–7.7)
Neutrophils Relative %: 87 %

## 2019-03-25 LAB — PROTIME-INR
INR: 1.2 (ref 0.8–1.2)
Prothrombin Time: 15.4 seconds — ABNORMAL HIGH (ref 11.4–15.2)

## 2019-03-25 LAB — APTT: aPTT: 32 seconds (ref 24–36)

## 2019-03-25 LAB — ETHANOL: Alcohol, Ethyl (B): <10 mg/dL (ref <10)

## 2019-03-25 MED ORDER — ALTEPLASE 100 MG IV SOLR
INTRAVENOUS | Status: AC
  Filled 2019-03-25: qty 100

## 2019-03-25 MED ORDER — DILTIAZEM LOAD VIA INFUSION
10.0000 mg | Freq: Once | INTRAVENOUS | Status: AC
  Administered 2019-03-25: 10 mg via INTRAVENOUS
  Filled 2019-03-25: qty 10

## 2019-03-25 MED ORDER — DILTIAZEM HCL-DEXTROSE 125-5 MG/125ML-% IV SOLN (PREMIX)
5.0000 mg/h | INTRAVENOUS | Status: DC
  Administered 2019-03-25: 5 mg/h via INTRAVENOUS
  Filled 2019-03-25: qty 125

## 2019-03-25 NOTE — Progress Notes (Signed)
1815 call time 1813 beeper time 1822 exam started 1822 exam finished 1822 images sent to [place] exam completed in [place] [facility] radiology called

## 2019-03-25 NOTE — Consult Note (Signed)
[facility] Tele[facility]urology Consult Services   Date of Service:   03/25/2019 18:27:48  Impression:     .  Right Hemispheric Infarct  Comments/Sign-Out: [name], Diabetes Mellitus Presents with left sided weakness and slurred speech Head CT: No acute Intracranial hemorrhage. NIHSS 3 Symptoms/presentation is consistent with Acute Ischemic Stroke right hemispheric, small vessel or cardioembolic (currently in Atrial Fibrillation) Last time known well>4.5 hours therefore not a candidate for Thrombolysis. Symptoms not suggestive of Large Vessel Occlusion.  Mechanism of Stroke: Possible Cardioembolic Small Vessel Disease  Metrics: Last Known Well: Unknown [facility] Notification Time: 03/25/2019 18:27:48 Arrival Time: 03/25/2019 18:06:00 Stamp Time: 03/25/2019 18:27:48 Time First Login Attempt: 03/25/2019 18:36:18 Video Start Time: 03/25/2019 18:36:18  Symptoms: slurred speech NIHSS Start Assessment Time: 03/25/2019 18:41:00 Patient is not a candidate for Alteplase/Activase. Patient was not deemed candidate for Alteplase/Activase thrombolytics because of Last Well Known Above 4.5 Hours. Video End Time: 03/25/2019 18:52:45  CT head was reviewed.  Clinical Presentation is not Suggestive of Large Vessel Occlusive Disease  ED Physician notified of diagnostic impression and management plan on 03/25/2019 18:55:12  Our recommendations are outlined below.  Recommendations:     .  Activate Stroke Protocol Admission/Order Set     .  Stroke/Telemetry Floor     .  Neuro Checks     .  Bedside Swallow Eval     .  DVT Prophylaxis     .  IV Fluids, Normal Saline     .  Head of Bed 30 Degrees     .  Euglycemia and Avoid Hyperthermia (PRN Acetaminophen)     .  Antiplatelet Therapy Recommended     .  Stroke work up with: Noncontrast Brain MRI, head and neck MRA (or CTA) if not done in the ER, lipid panel, HbA1c (if not done in the past 3  months), Transthoracic ECHO.     Marland Kitchen  Physical Therapy/Occupational Therapy/Speech Therapy  Routine Consultation with Inhouse Neurology for Follow up Care  Sign Out:     .  Discussed with Emergency Department Provider    ------------------------------------------------------------------------------  History of Present Illness: Patient is a 85 year old Female.  Patient was brought by private transportation with symptoms of slurred speech  [name], Diabetes Mellitus last known well per patient and Presenting symptoms/Chief complaint/reason for consult: unclear but more than 4.5 hours Presents with left sided weakness was noticed 14:30 and slurred speech which was noticed at 17:00 (it was the first time she spoke to someone today) Anticoagulation or Antiplatelet use: no Premorbid Level of function: Independent, Normal cognition, Speech and gait Chart reviewed for Pertinent medical, surgical, family history. Chart reviewed for Medications list and allergies. Pertinent positive and negative review of systems noted in History of Present Illness.   Past Medical History:     . Hypertension     . Diabetes Mellitus  Anticoagulant use:  No  Antiplatelet use: No  Examination: BP(168/103), Pulse(118), Blood Glucose(172) 1A: Level of Consciousness - Alert; keenly responsive + 0 1B: Ask Month and Age - Both Questions Right + 0 1C: Blink Eyes & Squeeze Hands - Performs Both Tasks + 0 2: Test Horizontal Extraocular Movements - Normal + 0 3: Test Visual Fields - No Visual Loss + 0 4: Test Facial Palsy (Use Grimace if Obtunded) - Minor paralysis (flat nasolabial fold, smile asymmetry) + 1 5A: Test Left Arm Motor Drift - Drift, but doesn't hit bed + 1 5B: Test Right  Arm Motor Drift - No Drift for 10 Seconds + 0 6A: Test Left Leg Motor Drift - No Drift for 5 Seconds + 0 6B: Test Right Leg Motor Drift - No Drift for 5 Seconds + 0 7: Test Limb Ataxia (FNF/Heel-Shin) - No  Ataxia + 0 8: Test Sensation - Normal; No sensory loss + 0 9: Test Language/Aphasia - Normal; No aphasia + 0 10: Test Dysarthria - Mild-Moderate Dysarthria: Slurring but can be understood + 1 11: Test Extinction/Inattention - No abnormality + 0  NIHSS Score: 3  Patient/Family was informed the Neurology Consult would happen via TeleHealth consult by way of interactive audio and video telecommunications and consented to receiving care in this manner.   Due to the immediate potential for life-threatening deterioration due to underlying acute neurologic illness, I spent 16 minutes providing critical care. This time includes time for face to face visit via telemedicine, review of medical records, imaging studies and discussion of findings with providers, the patient and/or family.   Dr Becky Johnson   [facility] [phone]   Case [id number]

## 2019-03-25 NOTE — ED Notes (Signed)
Pts granddaughter updated over the phone. Pt gave consent to update granddaughter.

## 2019-03-25 NOTE — ED Notes (Signed)
PT taken to CT scan by Eboni, RN at this time.

## 2019-03-25 NOTE — ED Notes (Signed)
Decision for no TPA made at this time due to unknown true last known well.

## 2019-03-25 NOTE — ED Notes (Signed)
Code stroke called and teleneurology cart at bedside and activated.

## 2019-03-25 NOTE — ED Provider Notes (Signed)
Emergency Department Provider Note   I have reviewed the triage vital signs and the nursing notes.   HISTORY  Chief Complaint No chief complaint on file.   HPI Becky Johnson is a 85 y.o. female with past medical history reviewed below presents to the emergency department by private vehicle with acute onset slurred speech and left facial droop.  Patient states that she was last normal at 2:30 PM.  She called her nephew who ultimately saw her and transported her to the emergency department where code stroke was activated from triage.  Patient denies any numbness.  She is not having any headache, chest pain, palpitations, shortness of breath.  She denies being on blood thinners.  No radiation of symptoms or other modifying factors.   No past medical history on file.  Patient Active Problem List   Diagnosis Date Noted  . SPINAL STENOSIS, LUMBAR 10/07/2009  . SCIATICA 09/28/2009  . HYPOTHYROIDISM 12/02/2008  . ANXIETY 12/02/2008  . HYPERTENSION, UNSPECIFIED 12/02/2008  . ABNORMAL EKG 12/02/2008   Allergies Patient has no allergy information on record.  No family history on file.  Social History Social History   Tobacco Use  . Smoking status: Not on file  Substance Use Topics  . Alcohol use: Not on file  . Drug use: Not on file    Review of Systems  Constitutional: No fever/chills Eyes: No visual changes. ENT: No sore throat. Cardiovascular: Denies chest pain. Respiratory: Denies shortness of breath. Gastrointestinal: No abdominal pain.  No nausea, no vomiting.  No diarrhea.  No constipation. Genitourinary: Negative for dysuria. Musculoskeletal: Negative for back pain. Skin: Negative for rash. Neurological: Negative for headaches. Positive left face droop and speech change.   10-point ROS otherwise negative.  ____________________________________________   PHYSICAL EXAM:  VITAL SIGNS: Vitals:   03/25/19 1906 03/25/19 1931  BP: (!) 165/106 (!)  170/138  Pulse: (!) 34   Resp:  (!) 29  SpO2: 90%     Constitutional: Alert and oriented. Well appearing and in no acute distress. Eyes: Conjunctivae are normal. PERRL. EOMI. Right gaze preference.  Head: Atraumatic. Nose: No congestion/rhinnorhea. Mouth/Throat: Mucous membranes are moist.  Neck: No stridor Cardiovascular: Irregularly irregular. Good peripheral circulation. Grossly normal heart sounds.   Respiratory: Normal respiratory effort.  No retractions. Lungs CTAB. Gastrointestinal: Soft and nontender. No distention.  Musculoskeletal: No lower extremity tenderness nor edema. No gross deformities of extremities. Neurologic: Patient with some dysarthria noted.  Left face droop noted as well with no sensory deficit.  Patient with left upper extremity pronator drift with 4/5 strength in the arm. Normal sensation.  Skin:  Skin is warm, dry and intact. No rash noted.  ____________________________________________   LABS (all labs ordered are listed, but only abnormal results are displayed)  Labs Reviewed  PROTIME-INR - Abnormal; Notable for the following components:      Result Value   Prothrombin Time 15.4 (*)    All other components within normal limits  CBC - Abnormal; Notable for the following components:   Hemoglobin 11.4 (*)    RDW 16.7 (*)    All other components within normal limits  DIFFERENTIAL - Abnormal; Notable for the following components:   Neutro Abs 7.9 (*)    All other components within normal limits  COMPREHENSIVE METABOLIC PANEL - Abnormal; Notable for the following components:   CO2 18 (*)    Glucose, Bld 151 (*)    Creatinine, Ser 1.19 (*)    Calcium 8.5 (*)  GFR calc non Af Amer 41 (*)    GFR calc Af Amer 47 (*)    All other components within normal limits  CBG MONITORING, ED - Abnormal; Notable for the following components:   Glucose-Capillary 172 (*)    All other components within normal limits  SARS CORONAVIRUS 2 (TAT 6-24 HRS)  ETHANOL   APTT  RAPID URINE DRUG SCREEN, HOSP PERFORMED  URINALYSIS, ROUTINE W REFLEX MICROSCOPIC  I-STAT CHEM 8, ED   ____________________________________________  EKG   EKG Interpretation  Date/Time:  Monday March 25 2019 18:24:21 EDT Ventricular Rate:  133 PR Interval:    QRS Duration: 88 QT Interval:  332 QTC Calculation: 473 R Axis:   66 Text Interpretation:  Atrial fibrillation Anterior infarct, old Repolarization abnormality, prob rate related A-fib appears new  No STEMI  Confirmed by Nanda Quinton [phone]) on 03/25/2019 6:31:43 PM       ____________________________________________  RADIOLOGY  Ct Head Code Stroke Wo Contrast  Result Date: 03/25/2019 CLINICAL DATA:  Code stroke. Focal neuro deficit. Left facial droop today EXAM: CT HEAD WITHOUT CONTRAST TECHNIQUE: Contiguous axial images were obtained from the base of the skull through the vertex without intravenous contrast. COMPARISON:  CT head 07/28/2008 FINDINGS: Brain: Negative for acute cortical infarct. Negative for acute hemorrhage or mass. Progressive atrophy. Progressive changes throughout the white matter bilaterally which are symmetric and appear chronic. Vascular: Negative for hyperdense vessel Skull: Negative Sinuses/Orbits: Negative Other: None ASPECTS (Berry Stroke Program Early CT Score) - Ganglionic level infarction (caudate, lentiform nuclei, internal capsule, insula, M1-M3 cortex): 7 - Supraganglionic infarction (M4-M6 cortex): 3 Total score (0-10 with 10 being normal): 10 IMPRESSION: 1. No acute abnormality. 2. ASPECTS is 10 3. Progression of atrophy and white matter ischemia since 07/28/2008 4. These results were called by telephone at the time of interpretation on 03/25/2019 at 6:34 pm to provider Paloma Grange , who verbally acknowledged these results. Electronically Signed   By: Franchot Gallo M.D.   On: 03/25/2019 18:34    ____________________________________________   PROCEDURES  Procedure(s) performed:    Procedures  CRITICAL CARE Performed by: Margette Fast Total critical care time:50 minutes Critical care time was exclusive of separately billable procedures and treating other patients. Critical care was necessary to treat or prevent imminent or life-threatening deterioration. Critical care was time spent personally by me on the following activities: development of treatment plan with patient and/or surrogate as well as nursing, discussions with consultants, evaluation of patient's response to treatment, examination of patient, obtaining history from patient or surrogate, ordering and performing treatments and interventions, ordering and review of laboratory studies, ordering and review of radiographic studies, pulse oximetry and re-evaluation of patient's condition.  Nanda Quinton, MD Emergency Medicine  ____________________________________________   INITIAL IMPRESSION / ASSESSMENT AND PLAN / ED COURSE  Pertinent labs & imaging results that were available during my care of the patient were reviewed by me and considered in my medical decision making (see chart for details).   Patient arrives by private vehicle with stroke symptoms and deficits on exam.  Last seen normal at 2:30 PM today.  Patient appears to have a right gaze preference and possible left visual field cut. Patient is just inside the tPA window and with vision symptoms would consider LVO as well. Code Stroke activated. Sending for non-contrast CT. EKG shows what appears to be new atrial fibrillation.   06:34 PM  Called by Radiology regarding head CT. Nothing acute.   Spoke with Neuro. No  LVO concern at this time on their exam. No tPA offered given difficult to say exact last seen normal. No emergent CTA/perfusion. Plan for admit for CVA.   Patient with A-fib with RVR. Rate reasonably well controlled at this time without medication. Hold on Heparin for now in the setting of acute CVA with concern for increased ICH risk. Defer  to inpatient team on timing for this.   09:00 PM  Patient's HR up-trending. Will rate control with diltiazem. Patient also with some SOB symptoms and on new O2 requirement. Continuing to allow permissive HTN. Will follow CXR and reassess. CVA with PE much less likely unless patient has a PFO. Will rate control and reassess symptoms.   Discussed patient's case with [facility], Dr. Sharl Ma to request admission. Patient and family (if present) updated with plan. Care transferred to [facility].  I reviewed all nursing notes, vitals, pertinent old records, EKGs, labs, imaging (as available).  09:55 PM  HR improved with diltiazem. SOB symptoms significantly improved. CXR with some edema possible. COVID pending with no acute infection type symptoms.   ____________________________________________  FINAL CLINICAL IMPRESSION(S) / ED DIAGNOSES  Final diagnoses:  Cerebrovascular accident (CVA), unspecified mechanism (HCC)  Atrial fibrillation, unspecified type [facility])    Note:  This document was prepared using Dragon voice recognition software and may include unintentional dictation errors.  Alona Bene, MD, [facility]    Avril Busser, Arlyss Repress, MD 03/25/19 [phone]

## 2019-03-25 NOTE — ED Triage Notes (Signed)
At 1430 pt noticed slurred speech and a left sided facial droop. Pt presents with left sided facial droop, left sided weakness, and slurred speech. Called nephew about her possibly having a stroke at 81

## 2019-03-25 NOTE — ED Notes (Signed)
CODE STROKE PAGED @ [phone]

## 2019-03-26 ENCOUNTER — Other Ambulatory Visit: Payer: Self-pay

## 2019-03-26 ENCOUNTER — Inpatient Hospital Stay (HOSPITAL_COMMUNITY): Payer: Medicare Other

## 2019-03-26 ENCOUNTER — Encounter (HOSPITAL_COMMUNITY): Payer: Self-pay | Admitting: Student

## 2019-03-26 DIAGNOSIS — Z20828 Contact with and (suspected) exposure to other viral communicable diseases: Secondary | ICD-10-CM | POA: Diagnosis present

## 2019-03-26 DIAGNOSIS — I10 Essential (primary) hypertension: Secondary | ICD-10-CM | POA: Diagnosis not present

## 2019-03-26 DIAGNOSIS — R471 Dysarthria and anarthria: Secondary | ICD-10-CM | POA: Diagnosis present

## 2019-03-26 DIAGNOSIS — I4891 Unspecified atrial fibrillation: Secondary | ICD-10-CM

## 2019-03-26 DIAGNOSIS — E785 Hyperlipidemia, unspecified: Secondary | ICD-10-CM | POA: Diagnosis present

## 2019-03-26 DIAGNOSIS — I5031 Acute diastolic (congestive) heart failure: Secondary | ICD-10-CM | POA: Diagnosis present

## 2019-03-26 DIAGNOSIS — I110 Hypertensive heart disease with heart failure: Secondary | ICD-10-CM | POA: Diagnosis present

## 2019-03-26 DIAGNOSIS — E876 Hypokalemia: Secondary | ICD-10-CM | POA: Diagnosis present

## 2019-03-26 DIAGNOSIS — Z79899 Other long term (current) drug therapy: Secondary | ICD-10-CM | POA: Diagnosis not present

## 2019-03-26 DIAGNOSIS — I639 Cerebral infarction, unspecified: Secondary | ICD-10-CM | POA: Diagnosis present

## 2019-03-26 DIAGNOSIS — Z8249 Family history of ischemic heart disease and other diseases of the circulatory system: Secondary | ICD-10-CM | POA: Diagnosis not present

## 2019-03-26 DIAGNOSIS — I340 Nonrheumatic mitral (valve) insufficiency: Secondary | ICD-10-CM | POA: Diagnosis not present

## 2019-03-26 DIAGNOSIS — Z8673 Personal history of transient ischemic attack (TIA), and cerebral infarction without residual deficits: Secondary | ICD-10-CM | POA: Diagnosis not present

## 2019-03-26 DIAGNOSIS — J9601 Acute respiratory failure with hypoxia: Secondary | ICD-10-CM | POA: Diagnosis present

## 2019-03-26 DIAGNOSIS — I480 Paroxysmal atrial fibrillation: Secondary | ICD-10-CM | POA: Diagnosis not present

## 2019-03-26 DIAGNOSIS — F419 Anxiety disorder, unspecified: Secondary | ICD-10-CM | POA: Diagnosis present

## 2019-03-26 DIAGNOSIS — R29810 Facial weakness: Secondary | ICD-10-CM | POA: Diagnosis present

## 2019-03-26 DIAGNOSIS — E038 Other specified hypothyroidism: Secondary | ICD-10-CM | POA: Diagnosis not present

## 2019-03-26 DIAGNOSIS — Z79890 Hormone replacement therapy (postmenopausal): Secondary | ICD-10-CM | POA: Diagnosis not present

## 2019-03-26 DIAGNOSIS — R1312 Dysphagia, oropharyngeal phase: Secondary | ICD-10-CM | POA: Diagnosis present

## 2019-03-26 DIAGNOSIS — R1310 Dysphagia, unspecified: Secondary | ICD-10-CM

## 2019-03-26 DIAGNOSIS — I6340 Cerebral infarction due to embolism of unspecified cerebral artery: Secondary | ICD-10-CM | POA: Diagnosis present

## 2019-03-26 DIAGNOSIS — R29703 NIHSS score 3: Secondary | ICD-10-CM | POA: Diagnosis present

## 2019-03-26 DIAGNOSIS — R7302 Impaired glucose tolerance (oral): Secondary | ICD-10-CM | POA: Diagnosis present

## 2019-03-26 DIAGNOSIS — E039 Hypothyroidism, unspecified: Secondary | ICD-10-CM | POA: Diagnosis present

## 2019-03-26 DIAGNOSIS — G8194 Hemiplegia, unspecified affecting left nondominant side: Secondary | ICD-10-CM | POA: Diagnosis present

## 2019-03-26 LAB — RAPID URINE DRUG SCREEN, HOSP PERFORMED
Amphetamines: NOT DETECTED
Barbiturates: NOT DETECTED
Benzodiazepines: NOT DETECTED
Cocaine: NOT DETECTED
Opiates: NOT DETECTED
Tetrahydrocannabinol: NOT DETECTED

## 2019-03-26 LAB — LIPID PANEL
Cholesterol: 145 mg/dL (ref 0–200)
HDL: 57 mg/dL (ref >40)
LDL Cholesterol: 69 mg/dL (ref 0–99)
Total CHOL/HDL Ratio: 2.5 RATIO
Triglycerides: 95 mg/dL (ref <150)
VLDL: 19 mg/dL (ref 0–40)

## 2019-03-26 LAB — HEMOGLOBIN A1C
Hgb A1c MFr Bld: 6.3 % — ABNORMAL HIGH (ref 4.8–5.6)
Mean Plasma Glucose: 134.11 mg/dL

## 2019-03-26 LAB — URINALYSIS, ROUTINE W REFLEX MICROSCOPIC
Bilirubin Urine: NEGATIVE
Glucose, UA: NEGATIVE mg/dL
Hgb urine dipstick: NEGATIVE
Ketones, ur: 5 mg/dL — AB
Leukocytes,Ua: NEGATIVE
Nitrite: NEGATIVE
Protein, ur: NEGATIVE mg/dL
Specific Gravity, Urine: 1.025 (ref 1.005–1.030)
pH: 7 (ref 5.0–8.0)

## 2019-03-26 LAB — SARS CORONAVIRUS 2 (TAT 6-24 HRS): SARS Coronavirus 2: NEGATIVE

## 2019-03-26 LAB — ECHOCARDIOGRAM COMPLETE
Height: 62 in
Weight: 2462.1 oz

## 2019-03-26 LAB — BRAIN NATRIURETIC PEPTIDE: B Natriuretic Peptide: 344 pg/mL — ABNORMAL HIGH (ref 0.0–100.0)

## 2019-03-26 LAB — TSH: TSH: 9.143 u[IU]/mL — ABNORMAL HIGH (ref 0.350–4.500)

## 2019-03-26 LAB — MRSA PCR SCREENING: MRSA by PCR: NEGATIVE

## 2019-03-26 MED ORDER — DILTIAZEM HCL-DEXTROSE 125-5 MG/125ML-% IV SOLN (PREMIX)
5.0000 mg/h | INTRAVENOUS | Status: DC
  Administered 2019-03-26: 18:00:00 5 mg/h via INTRAVENOUS
  Administered 2019-03-27: 7.5 mg/h via INTRAVENOUS
  Filled 2019-03-26: qty 125

## 2019-03-26 MED ORDER — ENOXAPARIN SODIUM 30 MG/0.3ML ~~LOC~~ SOLN
30.0000 mg | SUBCUTANEOUS | Status: DC
  Administered 2019-03-26 – 2019-03-28 (×3): 30 mg via SUBCUTANEOUS
  Filled 2019-03-26 (×3): qty 0.3

## 2019-03-26 MED ORDER — ASPIRIN 325 MG PO TABS
325.0000 mg | ORAL_TABLET | Freq: Every day | ORAL | Status: DC
  Administered 2019-03-29: 325 mg via ORAL
  Filled 2019-03-26 (×2): qty 1

## 2019-03-26 MED ORDER — SODIUM CHLORIDE 0.9 % IV SOLN
INTRAVENOUS | Status: DC
  Administered 2019-03-26 (×2): via INTRAVENOUS

## 2019-03-26 MED ORDER — ENOXAPARIN SODIUM 40 MG/0.4ML ~~LOC~~ SOLN
40.0000 mg | SUBCUTANEOUS | Status: DC
  Administered 2019-03-26: 40 mg via SUBCUTANEOUS
  Filled 2019-03-26: qty 0.4

## 2019-03-26 MED ORDER — DILTIAZEM HCL 30 MG PO TABS: 30.0000 mg | ORAL_TABLET | Freq: Four times a day (QID) | ORAL | Status: DC

## 2019-03-26 MED ORDER — CHLORHEXIDINE GLUCONATE CLOTH 2 % EX PADS
6.0000 | MEDICATED_PAD | Freq: Every day | CUTANEOUS | Status: DC
  Administered 2019-03-26 – 2019-03-29 (×4): 6 via TOPICAL

## 2019-03-26 MED ORDER — ASPIRIN 300 MG RE SUPP
300.0000 mg | Freq: Every day | RECTAL | Status: DC
  Administered 2019-03-26 – 2019-03-28 (×3): 300 mg via RECTAL
  Filled 2019-03-26 (×3): qty 1

## 2019-03-26 MED ORDER — ACETAMINOPHEN 650 MG RE SUPP: 650.0000 mg | RECTAL | Status: DC | PRN

## 2019-03-26 MED ORDER — FUROSEMIDE 10 MG/ML IJ SOLN
40.0000 mg | Freq: Once | INTRAMUSCULAR | Status: AC
  Administered 2019-03-26: 40 mg via INTRAVENOUS
  Filled 2019-03-26: qty 4

## 2019-03-26 MED ORDER — LEVOTHYROXINE SODIUM 100 MCG/5ML IV SOLN
50.0000 ug | Freq: Every day | INTRAVENOUS | Status: DC
  Administered 2019-03-27 – 2019-03-29 (×3): 50 ug via INTRAVENOUS
  Filled 2019-03-26 (×3): qty 5

## 2019-03-26 MED ORDER — ACETAMINOPHEN 325 MG PO TABS: 650.0000 mg | ORAL_TABLET | ORAL | Status: DC | PRN

## 2019-03-26 MED ORDER — SIMVASTATIN 10 MG PO TABS: 20.0000 mg | ORAL_TABLET | Freq: Every morning | ORAL | Status: DC

## 2019-03-26 MED ORDER — STROKE: EARLY STAGES OF RECOVERY BOOK
Freq: Once | Status: DC
  Filled 2019-03-26: qty 1

## 2019-03-26 MED ORDER — ACETAMINOPHEN 160 MG/5ML PO SOLN: 650.0000 mg | ORAL | Status: DC | PRN

## 2019-03-26 MED ORDER — ORAL CARE MOUTH RINSE
15.0000 mL | Freq: Two times a day (BID) | OROMUCOSAL | Status: DC
  Administered 2019-03-26 – 2019-03-29 (×7): 15 mL via OROMUCOSAL

## 2019-03-26 MED ORDER — IOHEXOL 350 MG/ML SOLN
100.0000 mL | Freq: Once | INTRAVENOUS | Status: AC | PRN
  Administered 2019-03-26: 75 mL via INTRAVENOUS

## 2019-03-26 NOTE — Plan of Care (Signed)
  Problem: Acute Rehab PT Goals(only PT should resolve) Goal: Pt Will Go Supine/Side To Sit Outcome: Progressing Flowsheets (Taken 03/26/2019 1010) Pt will go Supine/Side to Sit:  with modified independence  with minimal assist Goal: Patient Will Perform Sitting Balance Outcome: Progressing Flowsheets (Taken 03/26/2019 1010) Patient will perform sitting balance: with supervision Goal: Patient Will Transfer Sit To/From Stand Outcome: Progressing Flowsheets (Taken 03/26/2019 1010) Patient will transfer sit to/from stand:  with modified independence  with minimal assist Goal: Pt Will Transfer Bed To Chair/Chair To Bed Outcome: Progressing Flowsheets (Taken 03/26/2019 1010) Pt will Transfer Bed to Chair/Chair to Bed:  with modified independence  with min assist Goal: Pt Will Perform Standing Balance Or Pre-Gait Outcome: Progressing Flowsheets (Taken 03/26/2019 1010) Pt will perform standing balance or pre-gait:  with Modified Independent  with Supervision Goal: Pt Will Ambulate Outcome: Progressing Flowsheets (Taken 03/26/2019 1010) Pt will Ambulate:  50 feet  with modified independence  with rolling walker  with min guard assist  10:12 AM, 03/26/19 Mearl Latin PT, DPT Physical Therapist at [facility]

## 2019-03-26 NOTE — ED Notes (Signed)
Spoke with Cardiology PA. She d/c pt's IV Cardizem order in hopes that pt could take PO, however pt failed stroke swallow screen. Verbal orders given to continue IV Cardizem at 5mg /hr. HR in 80s at this time.

## 2019-03-26 NOTE — Consult Note (Signed)
[place] A. Merlene Laughter, MD     [email or web address]          Becky Johnson is an 85 y.o. female.   ASSESSMENT/PLAN: 1.  Acute right frontal infarct in the setting of new onset atrial fibrillation.  Other risk factors include age and hypertension.  Given the size of the infarct, I recommend that anticoagulation be held for 1 week.  She can continue with aspirin until then.  At that time the aspirin can be discontinued and one of the newer direct thrombin inhibitors are recommended.  Other recommendations include using a statin and blood pressure control.    The patient is an 85 year old right-handed white female who presents with the acute onset of dysarthria and left facial weakness.  She presented outside the thrombolysis window but was not considered for thrombectomy based on signs and symptoms and imaging not suggestive of large vessel disease.  Patient has been diagnosed with new onset atrial fibrillation.  She reports that her difficulty speaking and facial weakness has improved.  She denies any palpitation or chest pain or shortness of breath.  There are no reports of dizziness or headaches.  She lives alone and is typically functional and the independent.  She has family members who live next to her however.  She had 1 daughter who died but has grandchildren and other family members.  The review of systems otherwise negative.    GENERAL: This a pleasant average white female who is in no acute distress.  HEENT: The neck is supple no trauma appreciated.  ABDOMEN: soft  EXTREMITIES: No edema; there is arthritic changes noted in the knees and upper extremities.  BACK: Normal  SKIN: Normal by inspection.    MENTAL STATUS: Alert and oriented to hospital and month but states her age is 33 (1). Speech -slightly dysarthric (1), language and cognition are generally intact. Judgment and insight normal.   CRANIAL NERVES: Pupils are equal, round and reactive to  light and accomodation; extra ocular movements are full, there is no significant nystagmus; visual fields are full; upper and lower facial muscles are normal in strength and symmetric, there is flattening of the L nasolabial fold; tongue is midline; uvula is midline; shoulder elevation is normal.  Visual fields are intact.  MOTOR: Normal tone, bulk and strength; no pronator drift.  There is no drift of the upper or lower extremities.  COORDINATION: Left finger to nose is normal, right finger to nose is normal, No rest tremor; no intention tremor; no postural tremor; no bradykinesia.  REFLEXES: Deep tendon reflexes are symmetrical and normal. Plantar reflexes are equivocal bilaterally.   SENSATION: Normal to light touch, temperature, and pain.  There is no extinction of double simultaneous stimulation.  NIH stroke scale 1, 1 total 2.     Blood pressure (!) 149/77, pulse 72, temperature 98 F (36.7 C), temperature source Oral, resp. rate 18, height 5\' 2"  (1.575 m), weight 69.8 kg, SpO2 96 %.  Past Medical History:  Diagnosis Date  . Essential hypertension   . Hypothyroidism     History reviewed. No pertinent surgical history.  Family History  Problem Relation Age of Onset  . Hypertension Mother     Social History:  has no history on file for tobacco, alcohol, and drug.  Allergies: No Known Allergies  Medications: Prior to Admission medications   Medication Sig Start Date End Date Taking? Authorizing Provider  acetaminophen (ARTHRITIS PAIN) 650 MG CR tablet Take 650 mg by mouth every 8 (  eight) hours as needed for pain.   Yes [provider]  Carboxymethylcellulose Sodium (THERATEARS) 0.25 % SOLN Apply 1-2 drops to eye daily as needed (for irritation/dry eye).   Yes [provider]  levothyroxine (SYNTHROID) 100 MCG tablet Take 100 mcg by mouth every morning.  01/02/19  Yes [provider]  metoprolol succinate (TOPROL-XL) 50 MG 24 hr tablet Take 50 mg by  mouth daily. 01/02/19  Yes [provider]  pantoprazole (PROTONIX) 40 MG tablet Take 40 mg by mouth daily. 03/20/19  Yes [provider]  simvastatin (ZOCOR) 20 MG tablet Take 20 mg by mouth every morning.  01/15/19  Yes [provider]    Scheduled Meds: .  stroke: mapping our early stages of recovery book   Does not apply Once  . aspirin  300 mg Rectal Daily   Or  . aspirin  325 mg Oral Daily  . Chlorhexidine Gluconate Cloth  6 each Topical Daily  . enoxaparin (LOVENOX) injection  30 mg Subcutaneous Q24H  . [START ON 03/27/2019] levothyroxine  50 mcg Intravenous Daily  . mouth rinse  15 mL Mouth Rinse BID   Continuous Infusions: . sodium chloride 75 mL/hr at 03/26/19 1509  . diltiazem (CARDIZEM) infusion 5 mg/hr (03/26/19 1823)   PRN Meds:.acetaminophen **OR** acetaminophen (TYLENOL) oral liquid 160 mg/5 mL **OR** acetaminophen     Results for orders placed or performed during the hospital encounter of 03/25/19 (from the past 48 hour(s))  CBG monitoring, ED     Status: Abnormal   Collection Time: 03/25/19  6:21 PM  Result Value Ref Range   Glucose-Capillary 172 (H) 70 - 99 mg/dL  SARS CORONAVIRUS 2 (TAT 6-24 HRS) Nasopharyngeal Nasopharyngeal Swab     Status: None   Collection Time: 03/25/19  7:38 PM   Specimen: Nasopharyngeal Swab  Result Value Ref Range   SARS Coronavirus 2 NEGATIVE NEGATIVE    Comment: (NOTE) SARS-CoV-2 target nucleic acids are NOT DETECTED. The SARS-CoV-2 RNA is generally detectable in upper and lower respiratory specimens during the acute phase of infection. Negative results do not preclude SARS-CoV-2 infection, do not rule out co-infections with other pathogens, and should not be used as the sole basis for treatment or other patient management decisions. Negative results must be combined with clinical observations, patient history, and epidemiological information. The expected result is Negative. Fact Sheet for Patients:  HairSlick.no Fact Sheet for Healthcare Providers: quierodirigir.com This test is not yet approved or cleared by the [place] FDA and  has been authorized for detection and/or diagnosis of SARS-CoV-2 by FDA under an Emergency Use Authorization (EUA). This EUA will remain  in effect (meaning this test can be used) for the duration of the COVID-19 declaration under Section 56 4(b)(1) of the Act, 21 U.S.C. section 360bbb-3(b)(1), unless the authorization is terminated or revoked sooner. Performed at [facility], [address] [place], [place] [address]   Ethanol     Status: None   Collection Time: 03/25/19  7:41 PM  Result Value Ref Range   Alcohol, Ethyl (B) <10 <10 mg/dL    Comment: (NOTE) Lowest detectable limit for serum alcohol is 10 mg/dL. For medical purposes only. Performed at [facility], [address], [place], [place] [address]   Protime-INR     Status: Abnormal   Collection Time: 03/25/19  7:41 PM  Result Value Ref Range   Prothrombin Time 15.4 (H) 11.4 - 15.2 seconds   INR 1.2 0.8 - 1.2  Comment: (NOTE) INR goal varies based on device and disease states. Performed at [facility], [address] [place], [place] [address]   APTT     Status: None   Collection Time: 03/25/19  7:41 PM  Result Value Ref Range   aPTT 32 24 - 36 seconds    Comment: Performed at [facility], [address] [place], [place] [address]  CBC     Status: Abnormal   Collection Time: 03/25/19  7:41 PM  Result Value Ref Range   WBC 9.1 4.0 - 10.5 K/uL   RBC 4.17 3.87 - 5.11 MIL/uL   Hemoglobin 11.4 (L) 12.0 - 15.0 g/dL   HCT 29.537.6 28.436.0 - 13.246.0 %   MCV 90.2 80.0 - 100.0 fL   MCH 27.3 26.0 - 34.0 pg   MCHC 30.3 30.0 - 36.0 g/dL   RDW 44.016.7 (H) 10.211.5 - 72.515.5 %   Platelets 241 150 - 400 K/uL   nRBC 0.0 0.0 - 0.2 %    Comment: Performed at [facility], [address] [place], [place] [address]  Differential     Status:  Abnormal   Collection Time: 03/25/19  7:41 PM  Result Value Ref Range   Neutrophils Relative % 87 %   Neutro Abs 7.9 (H) 1.7 - 7.7 K/uL   Lymphocytes Relative 7 %   Lymphs Abs 0.7 0.7 - 4.0 K/uL   Monocytes Relative 5 %   Monocytes Absolute 0.4 0.1 - 1.0 K/uL   Eosinophils Relative 1 %   Eosinophils Absolute 0.1 0.0 - 0.5 K/uL   Basophils Relative 0 %   Basophils Absolute 0.0 0.0 - 0.1 K/uL   Immature Granulocytes 0 %   Abs Immature Granulocytes 0.03 0.00 - 0.07 K/uL    Comment: Performed at [facility], [address] [place], [place] [address]  Comprehensive metabolic panel     Status: Abnormal   Collection Time: 03/25/19  7:41 PM  Result Value Ref Range   Sodium 136 135 - 145 mmol/L   Potassium 3.6 3.5 - 5.1 mmol/L   Chloride 106 98 - 111 mmol/L   CO2 18 (L) 22 - 32 mmol/L   Glucose, Bld 151 (H) 70 - 99 mg/dL   BUN 21 8 - 23 mg/dL   Creatinine, Ser 2.591.19 (H) 0.44 - 1.00 mg/dL   Calcium 8.5 (L) 8.9 - 10.3 mg/dL   Total Protein 7.1 6.5 - 8.1 g/dL   Albumin 3.6 3.5 - 5.0 g/dL   AST 23 15 - 41 U/L   ALT 18 0 - 44 U/L   Alkaline Phosphatase 50 38 - 126 U/L   Total Bilirubin 0.6 0.3 - 1.2 mg/dL   GFR calc non Af Amer 41 (L) >60 mL/min   GFR calc Af Amer 47 (L) >60 mL/min   Anion gap 12 5 - 15    Comment: Performed at [facility], [address] [place], [place] [address]  TSH     Status: Abnormal   Collection Time: 03/25/19  7:41 PM  Result Value Ref Range   TSH 9.143 (H) 0.350 - 4.500 uIU/mL    Comment: Performed by a 3rd Generation assay with a functional sensitivity of <=0.01 uIU/mL. Performed at [facility], [address] [place], [place] [address]   Hemoglobin A1c     Status: Abnormal   Collection Time: 03/25/19  7:41 PM  Result Value Ref Range   Hgb A1c MFr Bld 6.3 (H) 4.8 - 5.6 %    Comment: (  NOTE) Pre diabetes:          5.7%-6.4% Diabetes:              >6.4% Glycemic control for   <7.0% adults with diabetes    Mean Plasma Glucose 134.11 mg/dL    Comment:  Performed at [facility], [address], [place], [place] [address]  Lipid panel     Status: None   Collection Time: 03/26/19  5:15 AM  Result Value Ref Range   Cholesterol 145 0 - 200 mg/dL   Triglycerides 95 <981 mg/dL   HDL 57 >19 mg/dL   Total CHOL/HDL Ratio 2.5 RATIO   VLDL 19 0 - 40 mg/dL   LDL Cholesterol 69 0 - 99 mg/dL    Comment:        Total Cholesterol/HDL:CHD Risk Coronary Heart Disease Risk Table                     Men   Women  1/2 Average Risk   3.4   3.3  Average Risk       5.0   4.4  2 X Average Risk   9.6   7.1  3 X Average Risk  23.4   11.0        Use the calculated Patient Ratio above and the CHD Risk Table to determine the patient's CHD Risk.        ATP III CLASSIFICATION (LDL):  <100     mg/dL   Optimal  147-829  mg/dL   Near or Above                    Optimal  130-159  mg/dL   Borderline  562-130  mg/dL   High  >865     mg/dL   Very High Performed at [facility], [address], [place], [place] [address]   Brain natriuretic peptide     Status: Abnormal   Collection Time: 03/26/19  5:15 AM  Result Value Ref Range   B Natriuretic Peptide 344.0 (H) 0.0 - 100.0 pg/mL    Comment: Performed at [facility], [address], [place], [place] [address]  Urine rapid drug screen (hosp performed)     Status: None   Collection Time: 03/26/19  8:30 AM  Result Value Ref Range   Opiates NONE DETECTED NONE DETECTED   Cocaine NONE DETECTED NONE DETECTED   Benzodiazepines NONE DETECTED NONE DETECTED   Amphetamines NONE DETECTED NONE DETECTED   Tetrahydrocannabinol NONE DETECTED NONE DETECTED   Barbiturates NONE DETECTED NONE DETECTED    Comment: (NOTE) DRUG SCREEN FOR MEDICAL PURPOSES ONLY.  IF CONFIRMATION IS NEEDED FOR ANY PURPOSE, NOTIFY LAB WITHIN 5 DAYS. LOWEST DETECTABLE LIMITS FOR URINE DRUG SCREEN Drug Class                     Cutoff (ng/mL) Amphetamine and metabolites    1000 Barbiturate and metabolites    200 Benzodiazepine                  200 Tricyclics and metabolites     300 Opiates and metabolites        300 Cocaine and metabolites        300 THC                            50 Performed at [facility], [address] [place],  [place] [address]   Urinalysis, Routine w reflex microscopic     Status: Abnormal   Collection Time: 03/26/19  8:30 AM  Result Value Ref Range   Color, Urine STRAW (A) YELLOW   APPearance CLEAR CLEAR   Specific Gravity, Urine 1.025 1.005 - 1.030   pH 7.0 5.0 - 8.0   Glucose, UA NEGATIVE NEGATIVE mg/dL   Hgb urine dipstick NEGATIVE NEGATIVE   Bilirubin Urine NEGATIVE NEGATIVE   Ketones, ur 5 (A) NEGATIVE mg/dL   Protein, ur NEGATIVE NEGATIVE mg/dL   Nitrite NEGATIVE NEGATIVE   Leukocytes,Ua NEGATIVE NEGATIVE    Comment: Performed at [facility], [address] [place], [place] [address]  MRSA PCR Screening     Status: None   Collection Time: 03/26/19 10:49 AM   Specimen: Nasopharyngeal  Result Value Ref Range   MRSA by PCR NEGATIVE NEGATIVE    Comment:        The GeneXpert MRSA Assay (FDA approved for NASAL specimens only), is one component of a comprehensive MRSA colonization surveillance program. It is not intended to diagnose MRSA infection nor to guide or monitor treatment for MRSA infections. Performed at [facility], [address], [place], [place] [address]     Studies/Results:  CAROTID  IMPRESSION: 1. Mild (1-49%) stenosis proximal right internal carotid artery secondary to moderate focal heterogeneous atherosclerotic plaque. 2. Mild (1-49%) stenosis proximal left internal carotid artery secondary to moderate focal heterogeneous atherosclerotic plaque. 3. The vertebral arteries are patent with normal antegrade flow.    HEAD NECK CTA  FINDINGS: CTA NECK FINDINGS  Aortic arch: Visualized aortic arch of normal caliber. Aberrant right subclavian artery noted. No hemodynamically significant stenosis seen about the origin of the great vessels.  Right  carotid system: Right CCA widely patent from its origin to the bifurcation without stenosis. Bulky plaque about the right bifurcation/proximal right ICA with associated stenosis of up to 50% by NASCET criteria. Right ICA widely patent distally to the skull base without stenosis, dissection, or occlusion.  Left carotid system: Left CCA widely patent from its origin to the bifurcation. Bulky plaque about the left bifurcation/proximal left ICA with associated stenosis of up to 70% by NASCET criteria. Left ICA widely patent distally to the skull base without stenosis, dissection, or occlusion.  Vertebral arteries: Both vertebral arteries arise from the subclavian arteries. Vertebral arteries widely patent within the neck without stenosis dissection or occlusion.  Skeleton: No acute osseous finding. No discrete lytic or blastic osseous lesions. Mild cervical spondylosis at C5-6 and C6-7.  Other neck: No other acute soft tissue abnormality within the neck. Thyroid is absent. No mass lesion or adenopathy.  Upper chest: Enlarged prevascular node measures up to 1.7 cm, indeterminate. Large layering bilateral pleural effusions partially visualized. Associated dependent atelectasis. Hazy ground-glass opacity within the visualized lungs likely reflects a degree of pulmonary edema.  Review of the MIP images confirms the above findings  CTA HEAD FINDINGS  Anterior circulation: Petrous segments widely patent bilaterally. Scattered atheromatous plaque within the cavernous/supraclinoid ICAs with associated mild multifocal narrowing. No hemodynamically significant stenosis. ICA termini well perfused. A1 segments widely patent. Normal anterior communicating artery complex. Anterior cerebral arteries widely patent to their distal aspects without stenosis. M1 segments widely patent bilaterally. Normal MCA bifurcations. On the right, there is abrupt occlusion of a distal right M4 branch  (series 7, image 73), likely embolic in nature. Distal MCA branches otherwise well perfused bilaterally and fairly symmetric.  Posterior circulation: Vertebral arteries widely patent to  the vertebrobasilar junction without stenosis. Posterior inferior cerebral arteries patent bilaterally. Basilar widely patent to its distal aspect without stenosis. Superior cerebral arteries patent bilaterally. Both of the posterior cerebral arteries primarily supplied via the basilar. Scattered atheromatous change with associated mild to moderate multifocal stenoses noted within the P2 segments bilaterally. PCAs are perfused to their distal aspects.  Venous sinuses: Patent.  Anatomic variants: None significant.  Review of the MIP images confirms the above findings  IMPRESSION: 1. Abrupt occlusion of a distal right M4 branch, likely embolic. 2. Atheromatous plaque about the carotid bifurcations bilaterally, with associated stenoses of up to 50% on the right and 70% on the left. 3. Aberrant right subclavian artery. 4. Large layering bilateral pleural effusions with probable pulmonary interstitial edema, partially visualized. 5. Enlarged 1.7 cm mediastinal prevascular node, indeterminate, but could be reactive.   BRAIN MRI FINDINGS: Brain: Moderate area of restricted diffusion in the lateral right frontal lobe primarily involving cortex but also nearly reaching the right lateral ventricle. No superimposed hemorrhage. There is a background of mild-to-moderate chronic small vessel ischemia in the cerebral white matter for age. Remote lacunar infarct in the left paramedian pons. Age congruent cerebral volume loss. No hydrocephalus, collection, or masslike finding  Vascular: Normal flow voids  Skull and upper cervical spine: Negative for marrow lesion  Sinuses/Orbits: Negative  IMPRESSION: 1. Moderate to large acute infarct in the right lateral frontal lobe. 2. Mild-to-moderate  chronic small vessel ischemia for age.     BRAIN MRI 1. Left ventricular ejection fraction, by visual estimation, is 60 to 65%. The left ventricle has normal function. There is mildly increased left ventricular hypertrophy.  2. Left ventricular diastolic Doppler parameters are indeterminate pattern of LV diastolic filling.  3. Global right ventricle has normal systolic function.The right ventricular size is normal. No increase in right ventricular wall thickness.  4. Left atrial size was normal.  5. Right atrial size was normal.  6. The mitral valve is normal in structure. Mild mitral valve regurgitation. No evidence of mitral stenosis.  7. The tricuspid valve is not well visualized. Tricuspid valve regurgitation is trivial.  8. The aortic valve was not well visualized Aortic valve regurgitation was not visualized by color flow Doppler. Structurally normal aortic valve, with no evidence of sclerosis or stenosis.  9. The pulmonic valve was not well visualized. Pulmonic valve regurgitation is not visualized by color flow Doppler. 10. Moderately elevated pulmonary artery systolic pressure. 11. The inferior vena cava is normal in size with greater than 50% respiratory variability, suggesting right atrial pressure of 3 mmHg.    The brain MRI scan is reviewed in person.  DWI shows increase in the involving the lateral frontal lobe on the right side and a somewhat wedge-shaped fashion all the way to the frontal horn of the lateral ventricle.  This is associated with reduced on the ADC.  The size of the infarct is moderate.  There is mild confluent periventricular and deep white matter leukoencephalopathy.  There is a few small areas of lucency seen on T1 involving the basal ganglia bilaterally.  Some of these associated with increased signal on FLAIR imaging suggestive of prior lacunar infarcts.  No hemorrhages appreciated on T2 star.          Egidio Lofgren A. Gerilyn Pilgrim, M.D.  Diplomate, Research scientist (medical) of Psychiatry and Neurology ( Neurology). 03/26/2019, 6:30 PM

## 2019-03-26 NOTE — ED Notes (Signed)
Pt transported to CT ?

## 2019-03-26 NOTE — ED Notes (Signed)
Patient transported to MRI 

## 2019-03-26 NOTE — Progress Notes (Signed)
ASSUMPTION OF CARE NOTE   03/26/2019 1:08 PM  Christella Hartigan was seen and examined.  Pt was admitted this morning with concerns for acute CVA and atrial fibrillation with RVR.  Pt has failed swallow screen and remains NPO.  SLP eval pending.  MRI pending. Pt seen by cardiology.  Neurology consult requested.  The H&P by the admitting provider, orders, imaging was reviewed.  Please see new orders.  Will continue to follow.   Vitals:   03/26/19 1100 03/26/19 1110  BP: (!) 155/86   Pulse: 86 (!) 50  Resp: (!) 24 20  Temp:  97.7 F (36.5 C)  SpO2: 93% 94%    Results for orders placed or performed during the hospital encounter of 03/25/19  Ethanol  Result Value Ref Range   Alcohol, Ethyl (B) <10 <10 mg/dL  Protime-INR  Result Value Ref Range   Prothrombin Time 15.4 (H) 11.4 - 15.2 seconds   INR 1.2 0.8 - 1.2  APTT  Result Value Ref Range   aPTT 32 24 - 36 seconds  CBC  Result Value Ref Range   WBC 9.1 4.0 - 10.5 K/uL   RBC 4.17 3.87 - 5.11 MIL/uL   Hemoglobin 11.4 (L) 12.0 - 15.0 g/dL   HCT 37.6 36.0 - 46.0 %   MCV 90.2 80.0 - 100.0 fL   MCH 27.3 26.0 - 34.0 pg   MCHC 30.3 30.0 - 36.0 g/dL   RDW 16.7 (H) 11.5 - 15.5 %   Platelets 241 150 - 400 K/uL   nRBC 0.0 0.0 - 0.2 %  Differential  Result Value Ref Range   Neutrophils Relative % 87 %   Neutro Abs 7.9 (H) 1.7 - 7.7 K/uL   Lymphocytes Relative 7 %   Lymphs Abs 0.7 0.7 - 4.0 K/uL   Monocytes Relative 5 %   Monocytes Absolute 0.4 0.1 - 1.0 K/uL   Eosinophils Relative 1 %   Eosinophils Absolute 0.1 0.0 - 0.5 K/uL   Basophils Relative 0 %   Basophils Absolute 0.0 0.0 - 0.1 K/uL   Immature Granulocytes 0 %   Abs Immature Granulocytes 0.03 0.00 - 0.07 K/uL  Comprehensive metabolic panel  Result Value Ref Range   Sodium 136 135 - 145 mmol/L   Potassium 3.6 3.5 - 5.1 mmol/L   Chloride 106 98 - 111 mmol/L   CO2 18 (L) 22 - 32 mmol/L   Glucose, Bld 151 (H) 70 - 99 mg/dL   BUN 21 8 - 23 mg/dL   Creatinine, Ser  1.19 (H) 0.44 - 1.00 mg/dL   Calcium 8.5 (L) 8.9 - 10.3 mg/dL   Total Protein 7.1 6.5 - 8.1 g/dL   Albumin 3.6 3.5 - 5.0 g/dL   AST 23 15 - 41 U/L   ALT 18 0 - 44 U/L   Alkaline Phosphatase 50 38 - 126 U/L   Total Bilirubin 0.6 0.3 - 1.2 mg/dL   GFR calc non Af Amer 41 (L) >60 mL/min   GFR calc Af Amer 47 (L) >60 mL/min   Anion gap 12 5 - 15  Urine rapid drug screen (hosp performed)  Result Value Ref Range   Opiates NONE DETECTED NONE DETECTED   Cocaine NONE DETECTED NONE DETECTED   Benzodiazepines NONE DETECTED NONE DETECTED   Amphetamines NONE DETECTED NONE DETECTED   Tetrahydrocannabinol NONE DETECTED NONE DETECTED   Barbiturates NONE DETECTED NONE DETECTED  Urinalysis, Routine w reflex microscopic  Result Value Ref Range   Color, Urine  STRAW (A) YELLOW   APPearance CLEAR CLEAR   Specific Gravity, Urine 1.025 1.005 - 1.030   pH 7.0 5.0 - 8.0   Glucose, UA NEGATIVE NEGATIVE mg/dL   Hgb urine dipstick NEGATIVE NEGATIVE   Bilirubin Urine NEGATIVE NEGATIVE   Ketones, ur 5 (A) NEGATIVE mg/dL   Protein, ur NEGATIVE NEGATIVE mg/dL   Nitrite NEGATIVE NEGATIVE   Leukocytes,Ua NEGATIVE NEGATIVE  TSH  Result Value Ref Range   TSH 9.143 (H) 0.350 - 4.500 uIU/mL  Hemoglobin A1c  Result Value Ref Range   Hgb A1c MFr Bld 6.3 (H) 4.8 - 5.6 %   Mean Plasma Glucose 134.11 mg/dL  Lipid panel  Result Value Ref Range   Cholesterol 145 0 - 200 mg/dL   Triglycerides 95 <244 mg/dL   HDL 57 >01 mg/dL   Total CHOL/HDL Ratio 2.5 RATIO   VLDL 19 0 - 40 mg/dL   LDL Cholesterol 69 0 - 99 mg/dL  Brain natriuretic peptide  Result Value Ref Range   B Natriuretic Peptide 344.0 (H) 0.0 - 100.0 pg/mL  CBG monitoring, ED  Result Value Ref Range   Glucose-Capillary 172 (H) 70 - 99 mg/dL   TIME SPENT 30 MINS  Maryln Manuel, MD [facility]   03/25/2019  6:15 PM How to contact the [facility] Attending or Consulting provider 7A - 7P or covering provider during after hours 7P -7A, for this patient?   1. Check the care team in [facility] and look for a) attending/consulting [facility] provider listed and b) the [facility] team listed 2. Log into [email or web address] and use [facility] universal password to access. If you do not have the password, please contact the hospital operator. 3. Locate the [facility] provider you are looking for under [facility] and page to a number that you can be directly reached. 4. If you still have difficulty reaching the provider, please page the [facility] (Director on Call) for the Hospitalists listed on amion for assistance.

## 2019-03-26 NOTE — Progress Notes (Signed)
Attempted another bedside swallow screen and was unsuccessful. Pt given a small sip of water and pt started coughing immediately after. Per Caleb Popp, RN with stroke quality, will give Rectal dose of Aspirin as it was not given this morning.

## 2019-03-26 NOTE — Evaluation (Signed)
Physical Therapy Evaluation Patient Details Name: Becky Johnson MRN: [id number] DOB: Oct 18, 1927 Today's Date: 03/26/2019   History of Present Illness  Becky Johnson is a 85 y.o. female with past medical history reviewed below presents to the emergency department by private vehicle with acute onset slurred speech and left facial droop.  Patient states that she was last normal at 2:30 PM.  She called her nephew who ultimately saw her and transported her to the emergency department where code stroke was activated from triage.  Patient denies any numbness.  She is not having any headache, chest pain, palpitations, shortness of breath.  She denies being on blood thinners.  No radiation of symptoms or other modifying factors.    Clinical Impression  Patient limited for functional mobility as stated below secondary to BLE weakness, fatigue and poor standing balance. Patient requires mod assist for bed mobility and transition to seated with limited carry over with verbal cueing/instruction on how to perform mobility. She is able to maintain standing with use of RW and mod assist. Patient able to complete side stepping and ambulation with RW, assist, and frequent verbal cueing for sequencing. Patient demonstrates overall generalized weakness, moderate LLE weakness, and impaired activity tolerance. Patient will benefit from continued physical therapy in hospital and recommended venue below to increase strength, balance, endurance for safe ADLs and gait.    Follow Up Recommendations SNF    Equipment Recommendations  Rolling walker with 5" wheels    Recommendations for Other Services       Precautions / Restrictions Precautions Precautions: Fall Restrictions Weight Bearing Restrictions: No      Mobility  Bed Mobility Overal bed mobility: Needs Assistance Bed Mobility: Supine to Sit     Supine to sit: Mod assist     General bed mobility comments: requires mod assist, limited  carry over with verbal instructions  Transfers Overall transfer level: Needs assistance Equipment used: Rolling walker (2 wheeled) Transfers: Sit to/from Stand Sit to Stand: Min assist;From elevated surface         General transfer comment: required min assist and verbal/tactle cueing for use of RW  Ambulation/Gait Ambulation/Gait assistance: Mod assist(with use of RW) Gait Distance (Feet): 6 Feet Assistive device: Rolling walker (2 wheeled) Gait Pattern/deviations: Step-through pattern;Decreased step length - right;Decreased step length - left;Decreased stride length;Shuffle     General Gait Details: slow, labored, very small/shuffled steps, required mod assist and RW  Stairs            Wheelchair Mobility    Modified Rankin (Stroke Patients Only)       Balance Overall balance assessment: Needs assistance Sitting-balance support: Bilateral upper extremity supported Sitting balance-Leahy Scale: Fair Sitting balance - Comments: at bedside, feet could not reach floor   Standing balance support: Bilateral upper extremity supported(using RW) Standing balance-Leahy Scale: Poor Standing balance comment: fair/poor with RW and assist                             Pertinent Vitals/Pain Pain Assessment: No/denies pain    Home Living Family/patient expects to be discharged to:: Private residence Living Arrangements: Alone Available Help at Discharge: Family;Friend(s);Available PRN/intermittently Type of Home: Apartment Home Access: Level entry     Home Layout: One level Home Equipment: Walker - 4 wheels;Cane - single point      Prior Function Level of Independence: Needs assistance   Gait / Transfers Assistance Needed: household ambulator with AD  ADL's / Homemaking Assistance Needed: requires assistance from family        Hand Dominance        Extremity/Trunk Assessment   Upper Extremity Assessment Upper Extremity Assessment: Generalized  weakness    Lower Extremity Assessment Lower Extremity Assessment: Generalized weakness;LLE deficits/detail(minimally decreased strength througout LLE) LLE Deficits / Details: minimally decreased strength LLE Sensation: WNL       Communication   Communication: Expressive difficulties  Cognition Arousal/Alertness: Awake/alert Behavior During Therapy: WFL for tasks assessed/performed;Flat affect Overall Cognitive Status: Within Functional Limits for tasks assessed                                        General Comments      Exercises     Assessment/Plan    PT Assessment Patient needs continued PT services  PT Problem List Decreased strength;Decreased activity tolerance;Decreased balance;Decreased mobility;Decreased safety awareness       PT Treatment Interventions DME instruction;Gait training;Functional mobility training;Therapeutic activities;Therapeutic exercise;Balance training;Neuromuscular re-education;Patient/family education    PT Goals (Current goals can be found in the Care Plan section)  Acute Rehab PT Goals Patient Stated Goal: Return home after rehab PT Goal Formulation: With patient Time For Goal Achievement: 04/09/19 Potential to Achieve Goals: Fair    Frequency 7X/week   Barriers to discharge        Co-evaluation               AM-PAC PT "6 Clicks" Mobility  Outcome Measure Help needed turning from your back to your side while in a flat bed without using bedrails?: A Lot Help needed moving from lying on your back to sitting on the side of a flat bed without using bedrails?: A Lot Help needed moving to and from a bed to a chair (including a wheelchair)?: A Lot Help needed standing up from a chair using your arms (e.g., wheelchair or bedside chair)?: A Lot Help needed to walk in hospital room?: A Lot Help needed climbing 3-5 steps with a railing? : A Lot 6 Click Score: 12    End of Session Equipment Utilized During Treatment:  Oxygen Activity Tolerance: Patient tolerated treatment well;Patient limited by fatigue Patient left: in bed;with call bell/phone within reach Nurse Communication: Mobility status PT Visit Diagnosis: Unsteadiness on feet (R26.81);Other abnormalities of gait and mobility (R26.89);Muscle weakness (generalized) (M62.81);Other symptoms and signs involving the nervous system (R29.898)    Time: 0177-9390 PT Time Calculation (min) (ACUTE ONLY): 32 min   Charges:   PT Evaluation $PT Eval Moderate Complexity: 1 Mod PT Treatments $Therapeutic Activity: 23-37 mins       10:09 AM, 03/26/19 Wyman Songster PT, DPT Physical Therapist at [facility]

## 2019-03-26 NOTE — Evaluation (Signed)
Clinical/Bedside Swallow Evaluation Patient Details  Name: Becky Johnson MRN: [id number] Date of Birth: 02-23-1928  Today's Date: 03/26/2019 Time: SLP Start Time (ACUTE ONLY): 1321 SLP Stop Time (ACUTE ONLY): 1340 SLP Time Calculation (min) (ACUTE ONLY): 19 min  Past Medical History:  Past Medical History:  Diagnosis Date  . Essential hypertension   . Hypothyroidism    Past Surgical History: History reviewed. No pertinent surgical history. HPI:  Becky Johnson  is a 85 y.o. female, with history of hypertension, hypothyroidism was brought to the ED by private vehicle with acute onset slurred speech and left facial droop.  Patient says that she was last normal at 2:30 PM she called her nephew who ultimately saw her and transported her to ER, code stroke was activated from triage. In the ED tele neurology was consulted, patient was not found to be a TPA candidate as she was out of window for TPA.  Patient was found to be in A. fib with RVR, no anticoagulation recommended due to stroke.  CT head was negative for acute abnormality. MRI shows: Moderate to large acute infarct in the right lateral frontal lobe. Pt failed [employer] screen. BSE and SLE ordered   Assessment / Plan / Recommendation Clinical Impression  Clinical swallow evaluation completed at bedside. Pt presents with left facial asymmetry in setting of acute CVA, mild dysarthria. Pt provided oral care to help with xerostomia. Pt with delays in swallow initiation and often unable to palpate with ice chip trials and small sips of water. Pt with immediate coughing after small sips water. Recommend continued NPO for the night with SLP to re-evaluate tomorrow AM. Above to RN with recommendation for ice chips presented after oral care.  SLP Visit Diagnosis: Dysphagia, oropharyngeal phase (R13.12)    Aspiration Risk  Severe aspiration risk;Risk for inadequate nutrition/hydration    Diet Recommendation NPO;Ice chips PRN  after oral care   Medication Administration: Via alternative means    Other  Recommendations Oral Care Recommendations: Oral care prior to ice chip/H20;Staff/trained caregiver to provide oral care   Follow up Recommendations Skilled Nursing facility      Frequency and Duration min 2x/week  1 week       Prognosis Prognosis for Safe Diet Advancement: Fair Barriers to Reach Goals: Severity of deficits      Swallow Study   General Date of Onset: 03/25/19 HPI: Becky Johnson  is a 85 y.o. female, with history of hypertension, hypothyroidism was brought to the ED by private vehicle with acute onset slurred speech and left facial droop.  Patient says that she was last normal at 2:30 PM she called her nephew who ultimately saw her and transported her to ER, code stroke was activated from triage. In the ED tele neurology was consulted, patient was not found to be a TPA candidate as she was out of window for TPA.  Patient was found to be in A. fib with RVR, no anticoagulation recommended due to stroke.  CT head was negative for acute abnormality. MRI shows: Moderate to large acute infarct in the right lateral frontal lobe. Pt failed [employer] screen. BSE and SLE ordered Type of Study: Bedside Swallow Evaluation Previous Swallow Assessment: None on record Diet Prior to this Study: NPO Temperature Spikes Noted: No Respiratory Status: Nasal cannula History of Recent Intubation: No Behavior/Cognition: Alert;Cooperative;Pleasant mood Oral Cavity Assessment: Within Functional Limits Oral Care Completed by SLP: Yes Oral Cavity - Dentition: Adequate natural dentition Vision: Functional for self-feeding Self-Feeding Abilities: Able  to feed self Patient Positioning: Upright in bed Baseline Vocal Quality: Normal Volitional Cough: Weak Volitional Swallow: Able to elicit    Oral/Motor/Sensory Function Overall Oral Motor/Sensory Function: Mild impairment Facial ROM: Reduced left;Suspected CN  VII (facial) dysfunction Facial Symmetry: Abnormal symmetry left;Suspected CN VII (facial) dysfunction Facial Strength: Suspected CN VII (facial) dysfunction;Reduced left Facial Sensation: Within Functional Limits Lingual ROM: Within Functional Limits Lingual Symmetry: Within Functional Limits Lingual Strength: Within Functional Limits Velum: Within Functional Limits Mandible: Within Functional Limits   Ice Chips Ice chips: Impaired Presentation: Spoon Pharyngeal Phase Impairments: Suspected delayed Swallow;Decreased hyoid-laryngeal movement;Unable to trigger swallow   Thin Liquid Thin Liquid: Impaired Presentation: Spoon Pharyngeal  Phase Impairments: Suspected delayed Swallow;Decreased hyoid-laryngeal movement;Unable to trigger swallow;Cough - Immediate    Nectar Thick Nectar Thick Liquid: Not tested   Honey Thick Honey Thick Liquid: Not tested   Puree Puree: Not tested   Solid     Solid: Not tested     Thank you,  Genene Churn, [place]  Xaviar Lunn 03/26/2019,6:06 PM

## 2019-03-26 NOTE — Consult Note (Addendum)
Cardiology Consult    Patient ID: Becky Johnson; [id number]; 1927-12-02   Admit date: 03/25/2019 Date of Consult: 03/26/2019  Primary Care Provider: Karleen Hampshire, MD Primary Cardiologist: [facility] to [facility] - Dr. Wyline Mood  Patient Profile    Becky Johnson is a 85 y.o. female with past medical history of HTN, HLD and Hypothyroidism who is being seen today for the evaluation of atrial fibrillation with RVR at the request of Dr. Laural Benes.   History of Present Illness    Becky Johnson presented to [facility] ED on 03/25/2019 for evaluation of new onset slurred speech and left facial droop. She reports being in her usual state of health until 3-4 days ago when she developed palpitations and felt like her heart was racing. Reports associated dyspnea. No chest pain, dizziness or presyncope. Had experienced palpitations in the past but is unaware of any documented arrhythmias. No known personal history of CAD or CHF. No family history of CAD or cardiac arrhythmias. She lives by herself with her grandchildren living nearby. Yesterday around 1430 she started to experience slurred speech and left-sided facial droop but did not present to the ED until 1800.  Code Stroke was activated but TPA was not administered given it was unclear the last time she was in her normal state.   Initial labs showed WBC 9.1, Hgb 11.4, platelets 241, Na+ 136, K+ 3.6 and creatinine 1.19 (unknown baseline). COVID pending. TSH 9.143.  FLP shows total cholesterol 145, triglycerides 95, HDL 57 and LDL 69. EKG shows atrial fibrillation with RVR, HR 133 and anterior infarct pattern with no prior tracings available for comparison. CT Head showed no acute abnormalities. Was noted to have progression of atrophy and white matter ischemia since prior imaging in 2010. CXR showed cardiomegaly with diffuse bilateral interstitial pulmonary opacity and bilateral pleural effusions. CT Angio of the head showed an abrupt occlusion of  the distal right M4 Solara Goodchild felt to be embolic and plaque along the carotid bifurcations bilaterally. MRI Brain showed a moderate to large acute infarct in the right lateral frontal lobe and mild to moderate chronic small vessel ischemia.  She has been started on IV Cardizem for rate control along with ASA 325 mg daily. HR is currently in the 80's to 90's. Asymptomatic at this time.    Past Medical History:  Diagnosis Date   Essential hypertension    Hypothyroidism     History reviewed. No pertinent surgical history.   Home Medications:  Prior to Admission medications   Medication Sig Start Date End Date Taking? Authorizing Provider  acetaminophen (ARTHRITIS PAIN) 650 MG CR tablet Take 650 mg by mouth every 8 (eight) hours as needed for pain.   Yes [provider]  Carboxymethylcellulose Sodium (THERATEARS) 0.25 % SOLN Apply 1-2 drops to eye daily as needed (for irritation/dry eye).   Yes [provider]  levothyroxine (SYNTHROID) 100 MCG tablet Take 100 mcg by mouth every morning.  01/02/19  Yes [provider]  metoprolol succinate (TOPROL-XL) 50 MG 24 hr tablet Take 50 mg by mouth daily. 01/02/19  Yes [provider]  pantoprazole (PROTONIX) 40 MG tablet Take 40 mg by mouth daily. 03/20/19  Yes [provider]  simvastatin (ZOCOR) 20 MG tablet Take 20 mg by mouth every morning.  01/15/19  Yes [provider]    Inpatient Medications: Scheduled Meds:   stroke: mapping our early stages of recovery book   Does not apply Once   aspirin  300  mg Rectal Daily   Or   aspirin  325 mg Oral Daily   enoxaparin (LOVENOX) injection  40 mg Subcutaneous Q24H   [START ON 03/27/2019] levothyroxine  50 mcg Intravenous Daily   Continuous Infusions:  sodium chloride 75 mL/hr at 03/26/19 0037   diltiazem (CARDIZEM) infusion     PRN Meds: acetaminophen **OR** acetaminophen (TYLENOL) oral liquid 160 mg/5 mL **OR** acetaminophen  Allergies:    No Known Allergies  Social History:   Social History   Socioeconomic History   Marital status: Married    Spouse name: Not on file   Number of children: Not on file   Years of education: Not on file   Highest education level: Not on file  Occupational History   Not on file  Social Needs   Financial resource strain: Not on file   Food insecurity    Worry: Not on file    Inability: Not on file   Transportation needs    Medical: Not on file    Non-medical: Not on file  Tobacco Use   Smoking status: Not on file  Substance and Sexual Activity   Alcohol use: Not on file   Drug use: Not on file   Sexual activity: Not on file  Lifestyle   Physical activity    Days per week: Not on file    Minutes per session: Not on file   Stress: Not on file  Relationships   Social connections    Talks on phone: Not on file    Gets together: Not on file    Attends religious service: Not on file    Active member of club or organization: Not on file    Attends meetings of clubs or organizations: Not on file    Relationship status: Not on file   Intimate partner violence    Fear of current or ex partner: Not on file    Emotionally abused: Not on file    Physically abused: Not on file    Forced sexual activity: Not on file  Other Topics Concern   Not on file  Social History Narrative   Not on file     Family History:    Family History  Problem Relation Age of Onset   Hypertension Mother       Review of Systems    General:  No chills, fever, night sweats or weight changes.  Cardiovascular:  No chest pain, dyspnea on exertion, edema, orthopnea,  paroxysmal nocturnal dyspnea. Positive for palpitations.  Dermatological: No rash, lesions/masses Respiratory: No cough, dyspnea Urologic: No hematuria, dysuria Abdominal:   No nausea, vomiting, diarrhea, bright red blood per rectum, melena, or hematemesis Neurologic:  No visual changes. Positive for facial droop and  weakness.   All other systems reviewed and are otherwise negative except as noted above.  Physical Exam/Data    Vitals:   03/26/19 0430 03/26/19 0530 03/26/19 0600 03/26/19 0700  BP: (!) 168/78 (!) 168/58 (!) 161/75 (!) 152/83  Pulse: (!) 56 86 (!) 138 (!) 169  Resp: (!) 26 (!) 26 (!) 23 (!) 25  SpO2: 94% 93% 94% 92%  Weight:      Height:        Intake/Output Summary (Last 24 hours) at 03/26/2019 0900 Last data filed at 03/26/2019 0022 Gross per 24 hour  Intake 24.5 ml  Output --  Net 24.5 ml   Filed Weights   03/25/19 1822 03/25/19 1826  Weight: 69.4 kg 69.4 kg   Body  mass index is 29.88 kg/m.   General: Pleasant elderly female appearing in NAD Psych: Normal affect. Neuro: Alert and oriented X 3. Moves all extremities spontaneously. HEENT: Facial droop noted.   Neck: Supple without bruits or JVD. Lungs:  Resp regular and unlabored, mild rales along bases bilaterally. Heart: Irregularly irregular. No s3, s4, or murmurs. Abdomen: Soft, non-tender, non-distended, BS + x 4.  Extremities: No clubbing, cyanosis or lower extremity edema. DP/PT/Radials 2+ and equal bilaterally.   EKG:  The EKG was personally reviewed and demonstrates: Atrial fibrillation with RVR, HR 133 and anterior infarct pattern with no prior tracings available for comparison.   Telemetry:  Telemetry was personally reviewed and demonstrates: Atrial fibrillation, HR initially in the 140's to 150's, improved into the 80's to 90's overnight and this AM.    Labs/Studies     Relevant CV Studies:  Echocardiogram: Pending  Laboratory Data:  Chemistry Recent Labs  Lab 03/25/19 1941  NA 136  K 3.6  CL 106  CO2 18*  GLUCOSE 151*  BUN 21  CREATININE 1.19*  CALCIUM 8.5*  GFRNONAA 41*  GFRAA 47*  ANIONGAP 12    Recent Labs  Lab 03/25/19 1941  PROT 7.1  ALBUMIN 3.6  AST 23  ALT 18  ALKPHOS 50  BILITOT 0.6   Hematology Recent Labs  Lab 03/25/19 1941  WBC 9.1  RBC 4.17  HGB 11.4*   HCT 37.6  MCV 90.2  MCH 27.3  MCHC 30.3  RDW 16.7*  PLT 241   Cardiac EnzymesNo results for input(s): TROPONINI in the last 168 hours. No results for input(s): TROPIPOC in the last 168 hours.  BNPNo results for input(s): BNP, PROBNP in the last 168 hours.  DDimer No results for input(s): DDIMER in the last 168 hours.  Radiology/Studies:  Ct Angio Head W Or Wo Contrast  Result Date: 03/26/2019 CLINICAL DATA:  Initial evaluation for acute slurred speech, left-sided facial droop. EXAM: CT ANGIOGRAPHY HEAD AND NECK TECHNIQUE: Multidetector CT imaging of the head and neck was performed using the standard protocol during bolus administration of intravenous contrast. Multiplanar CT image reconstructions and MIPs were obtained to evaluate the vascular anatomy. Carotid stenosis measurements (when applicable) are obtained utilizing NASCET criteria, using the distal internal carotid diameter as the denominator. CONTRAST:  75mL OMNIPAQUE IOHEXOL 350 MG/ML SOLN COMPARISON:  Prior head CT from 03/25/2019. FINDINGS: CTA NECK FINDINGS Aortic arch: Visualized aortic arch of normal caliber. Aberrant right subclavian artery noted. No hemodynamically significant stenosis seen about the origin of the great vessels. Right carotid system: Right CCA widely patent from its origin to the bifurcation without stenosis. Bulky plaque about the right bifurcation/proximal right ICA with associated stenosis of up to 50% by NASCET criteria. Right ICA widely patent distally to the skull base without stenosis, dissection, or occlusion. Left carotid system: Left CCA widely patent from its origin to the bifurcation. Bulky plaque about the left bifurcation/proximal left ICA with associated stenosis of up to 70% by NASCET criteria. Left ICA widely patent distally to the skull base without stenosis, dissection, or occlusion. Vertebral arteries: Both vertebral arteries arise from the subclavian arteries. Vertebral arteries widely patent  within the neck without stenosis dissection or occlusion. Skeleton: No acute osseous finding. No discrete lytic or blastic osseous lesions. Mild cervical spondylosis at C5-6 and C6-7. Other neck: No other acute soft tissue abnormality within the neck. Thyroid is absent. No mass lesion or adenopathy. Upper chest: Enlarged prevascular node measures up to 1.7 cm, indeterminate. Large  layering bilateral pleural effusions partially visualized. Associated dependent atelectasis. Hazy ground-glass opacity within the visualized lungs likely reflects a degree of pulmonary edema. Review of the MIP images confirms the above findings CTA HEAD FINDINGS Anterior circulation: Petrous segments widely patent bilaterally. Scattered atheromatous plaque within the cavernous/supraclinoid ICAs with associated mild multifocal narrowing. No hemodynamically significant stenosis. ICA termini well perfused. A1 segments widely patent. Normal anterior communicating artery complex. Anterior cerebral arteries widely patent to their distal aspects without stenosis. M1 segments widely patent bilaterally. Normal MCA bifurcations. On the right, there is abrupt occlusion of a distal right M4 Yancy Hascall (series 7, image 73), likely embolic in nature. Distal MCA branches otherwise well perfused bilaterally and fairly symmetric. Posterior circulation: Vertebral arteries widely patent to the vertebrobasilar junction without stenosis. Posterior inferior cerebral arteries patent bilaterally. Basilar widely patent to its distal aspect without stenosis. Superior cerebral arteries patent bilaterally. Both of the posterior cerebral arteries primarily supplied via the basilar. Scattered atheromatous change with associated mild to moderate multifocal stenoses noted within the P2 segments bilaterally. PCAs are perfused to their distal aspects. Venous sinuses: Patent. Anatomic variants: None significant. Review of the MIP images confirms the above findings IMPRESSION:  1. Abrupt occlusion of a distal right M4 Bradie Sangiovanni, likely embolic. 2. Atheromatous plaque about the carotid bifurcations bilaterally, with associated stenoses of up to 50% on the right and 70% on the left. 3. Aberrant right subclavian artery. 4. Large layering bilateral pleural effusions with probable pulmonary interstitial edema, partially visualized. 5. Enlarged 1.7 cm mediastinal prevascular node, indeterminate, but could be reactive. Electronically Signed   By: Rise Mu M.D.   On: 03/26/2019 03:32   Ct Angio Neck W Or Wo Contrast  Result Date: 03/26/2019 CLINICAL DATA:  Initial evaluation for acute slurred speech, left-sided facial droop. EXAM: CT ANGIOGRAPHY HEAD AND NECK TECHNIQUE: Multidetector CT imaging of the head and neck was performed using the standard protocol during bolus administration of intravenous contrast. Multiplanar CT image reconstructions and MIPs were obtained to evaluate the vascular anatomy. Carotid stenosis measurements (when applicable) are obtained utilizing NASCET criteria, using the distal internal carotid diameter as the denominator. CONTRAST:  68mL OMNIPAQUE IOHEXOL 350 MG/ML SOLN COMPARISON:  Prior head CT from 03/25/2019. FINDINGS: CTA NECK FINDINGS Aortic arch: Visualized aortic arch of normal caliber. Aberrant right subclavian artery noted. No hemodynamically significant stenosis seen about the origin of the great vessels. Right carotid system: Right CCA widely patent from its origin to the bifurcation without stenosis. Bulky plaque about the right bifurcation/proximal right ICA with associated stenosis of up to 50% by NASCET criteria. Right ICA widely patent distally to the skull base without stenosis, dissection, or occlusion. Left carotid system: Left CCA widely patent from its origin to the bifurcation. Bulky plaque about the left bifurcation/proximal left ICA with associated stenosis of up to 70% by NASCET criteria. Left ICA widely patent distally to the  skull base without stenosis, dissection, or occlusion. Vertebral arteries: Both vertebral arteries arise from the subclavian arteries. Vertebral arteries widely patent within the neck without stenosis dissection or occlusion. Skeleton: No acute osseous finding. No discrete lytic or blastic osseous lesions. Mild cervical spondylosis at C5-6 and C6-7. Other neck: No other acute soft tissue abnormality within the neck. Thyroid is absent. No mass lesion or adenopathy. Upper chest: Enlarged prevascular node measures up to 1.7 cm, indeterminate. Large layering bilateral pleural effusions partially visualized. Associated dependent atelectasis. Hazy ground-glass opacity within the visualized lungs likely reflects a degree of pulmonary edema. Review  of the MIP images confirms the above findings CTA HEAD FINDINGS Anterior circulation: Petrous segments widely patent bilaterally. Scattered atheromatous plaque within the cavernous/supraclinoid ICAs with associated mild multifocal narrowing. No hemodynamically significant stenosis. ICA termini well perfused. A1 segments widely patent. Normal anterior communicating artery complex. Anterior cerebral arteries widely patent to their distal aspects without stenosis. M1 segments widely patent bilaterally. Normal MCA bifurcations. On the right, there is abrupt occlusion of a distal right M4 Payden Docter (series 7, image 73), likely embolic in nature. Distal MCA branches otherwise well perfused bilaterally and fairly symmetric. Posterior circulation: Vertebral arteries widely patent to the vertebrobasilar junction without stenosis. Posterior inferior cerebral arteries patent bilaterally. Basilar widely patent to its distal aspect without stenosis. Superior cerebral arteries patent bilaterally. Both of the posterior cerebral arteries primarily supplied via the basilar. Scattered atheromatous change with associated mild to moderate multifocal stenoses noted within the P2 segments bilaterally.  PCAs are perfused to their distal aspects. Venous sinuses: Patent. Anatomic variants: None significant. Review of the MIP images confirms the above findings IMPRESSION: 1. Abrupt occlusion of a distal right M4 Shawnn Bouillon, likely embolic. 2. Atheromatous plaque about the carotid bifurcations bilaterally, with associated stenoses of up to 50% on the right and 70% on the left. 3. Aberrant right subclavian artery. 4. Large layering bilateral pleural effusions with probable pulmonary interstitial edema, partially visualized. 5. Enlarged 1.7 cm mediastinal prevascular node, indeterminate, but could be reactive. Electronically Signed   By: Jeannine Boga M.D.   On: 03/26/2019 03:32   Mr Brain Wo Contrast  Result Date: 03/26/2019 CLINICAL DATA:  Slurred speech and left-sided facial droop EXAM: MRI HEAD WITHOUT CONTRAST TECHNIQUE: Multiplanar, multiecho pulse sequences of the brain and surrounding structures were obtained without intravenous contrast. COMPARISON:  CTA head neck from earlier today FINDINGS: Brain: Moderate area of restricted diffusion in the lateral right frontal lobe primarily involving cortex but also nearly reaching the right lateral ventricle. No superimposed hemorrhage. There is a background of mild-to-moderate chronic small vessel ischemia in the cerebral white matter for age. Remote lacunar infarct in the left paramedian pons. Age congruent cerebral volume loss. No hydrocephalus, collection, or masslike finding Vascular: Normal flow voids Skull and upper cervical spine: Negative for marrow lesion Sinuses/Orbits: Negative IMPRESSION: 1. Moderate to large acute infarct in the right lateral frontal lobe. 2. Mild-to-moderate chronic small vessel ischemia for age. Electronically Signed   By: Monte Fantasia M.D.   On: 03/26/2019 07:56   Dg Chest Portable 1 View  Result Date: 03/25/2019 CLINICAL DATA:  Shortness of breath EXAM: PORTABLE CHEST 1 VIEW COMPARISON:  07/28/2008 FINDINGS: Cardiomegaly.  Diffuse bilateral interstitial pulmonary opacity and layering bilateral pleural effusions. The visualized skeletal structures are unremarkable. IMPRESSION: 1.  Cardiomegaly. 2. Diffuse bilateral interstitial pulmonary opacity and layering bilateral pleural effusions, most consistent with edema. Electronically Signed   By: Eddie Candle M.D.   On: 03/25/2019 21:18   Ct Head Code Stroke Wo Contrast  Result Date: 03/25/2019 CLINICAL DATA:  Code stroke. Focal neuro deficit. Left facial droop today EXAM: CT HEAD WITHOUT CONTRAST TECHNIQUE: Contiguous axial images were obtained from the base of the skull through the vertex without intravenous contrast. COMPARISON:  CT head 07/28/2008 FINDINGS: Brain: Negative for acute cortical infarct. Negative for acute hemorrhage or mass. Progressive atrophy. Progressive changes throughout the white matter bilaterally which are symmetric and appear chronic. Vascular: Negative for hyperdense vessel Skull: Negative Sinuses/Orbits: Negative Other: None ASPECTS ([place] Stroke Program Early CT Score) - Ganglionic level infarction (caudate,  lentiform nuclei, internal capsule, insula, M1-M3 cortex): 7 - Supraganglionic infarction (M4-M6 cortex): 3 Total score (0-10 with 10 being normal): 10 IMPRESSION: 1. No acute abnormality. 2. ASPECTS is 10 3. Progression of atrophy and white matter ischemia since 07/28/2008 4. These results were called by telephone at the time of interpretation on 03/25/2019 at 6:34 pm to provider JOSHUA LONG , who verbally acknowledged these results. Electronically Signed   By: Marlan Palauharles  Clark M.D.   On: 03/25/2019 18:34     Assessment & Plan    1. New-Onset Atrial Fibrillation with RVR - she reports palpitations for the past 3-4 days leading to admission but reports a history of intermittent palpitations as well. Electrolytes WNL. TSH elevated to 9.143. HR initially in the 140's to 150's, now well-controlled with IV Cardizem 5mg /hr. Will transition to PO  Cardizem 30 mg Q6H once able to take PO medications. If EF reduced by echocardiogram, will need to switch to BB therapy. Allowing permissive HTN in the setting of recent CVA and SBP is currently in the 150's to 160's.  - This patients CHA2DS2-VASc Score and unadjusted Ischemic Stroke Rate (% per year) is equal to 11.2 % stroke rate/year from a score of 7 (HTN, Vascular, Female, Age (2), CVA (2)). Would recommend initiation of Eliquis 5mg  BID for anticoagulation once cleared to start by Neurology and able to take PO medications. Only indication for reduced dosing at this time is age.  - if she remains in atrial fibrillation following 3 weeks of uninterrupted anticoagulation, could consider DCCV but given her history it is possible she has been having paroxysmal episodes in the past as well.   2. HTN - BP elevated to 141/58 - 195/154 while in the ED. At 152/83 on most recent check. Listed as being on Toprol-XL in the past but the patient is unsure if this is her current medication. Says she takes a "combination blood pressure medication with a diuretic" which is likely HCTZ. Will need to verify home medication regimen so medication reconciliation is accurate at the time of discharge.  3. HLD -  FLP shows total cholesterol 145, triglycerides 95, HDL 57 and LDL 69. Restart statin therapy once able to take PO medications.   4. Hypothyroidism - TSH elevated to 9.143. Check Free T4. Dose adjustment of PTA Synthroid per admitting team.   4. Carotid Artery Stenosis - noted to have carotid plaque on CT Imaging. Carotid Dopplers have been ordered and are pending at this time.   5. Acute CVA - CT Angio of the Head showed an abrupt occlusion of the distal right M4 Becky Johnson felt to be embolic. MRI Brain showed a moderate to large acute infarct in the right lateral frontal lobe and mild to moderate chronic small vessel ischemia. - Neurology following. Plan to start anticoagulation as outlined above once cleared by  Neurology.    For questions or updates, please contact [facility] Please consult [email or web address] for contact info under Cardiology/STEMI.  Signed, Ellsworth LennoxBrittany M Strader, PA-C 03/26/2019, 9:00 AM Pager: [phone]  Attending note  Patient seen and discussed with PA Iran OuchStrader, I agree with her documentation above. 85 yo female history of HTN and hypothyroidism admitted with slurred speech and left facial droop. Diagnosed with acute CVA, found not to be a tpa candidate as she was out of the time window. During admission new diagnosis of afib  WBC 9.1 Hgb 11.4 Plt 241 K 3.6 Cr 1.19 BUN 21 TSH 9.1 CT head: no acute process CXR bilateral intersitial  opacity, bilateral effusions CTA head/neck: occluded right M4, likely embolic. RICA 50%, LICA 70%.  MRI moderate to large acute infarct right lateral frontal lobe Carotid US pending Echo pending EKG afib, RVR   Patient admitted with CVA, management per primary team and neuro. New diagnosis of afib this admission, potential etiology of her CVA. Will need anticoag, defer timing to neurology given recent CVA and risk for hemorragic conversion. Would recommend eliquis  bid when able. On dilt gtt, when able to take PO would convert to oral diltiazem pending echo results. Rates currently controlled. Pulm edema and effusions noted on imaging, she reports some DOE over the last few days. Add on BNP to labs, f/u echo, dose IV lasix  x 1.     Dina Rich MD

## 2019-03-26 NOTE — H&P (Signed)
TRH H&P    Patient Demographics:    Becky Johnson, is a 85 y.o. female  MRN: [id number]  DOB - [birth year]  Admit Date - 03/25/2019  Referring MD/NP/PA: Alona BeneJoshua Long  Outpatient Primary MD for the patient is Karleen HampshireMcGough, William, MD  Patient coming from: Home  Chief complaint-slurred speech, facial droop   HPI:    Becky MaxcyChristine Langone  is a 85 y.o. female, with history of hypertension, hypothyroidism was brought to the ED by private vehicle with acute onset slurred speech and left facial droop.  Patient says that she was last normal at 2:30 PM she called her nephew who ultimately saw her and transported her to ER, code stroke was activated from triage. In the ED tele neurology was consulted, patient was not found to be a TPA candidate as she was out of window for TPA.  Patient was found to be in A. fib with RVR, no anticoagulation recommended due to stroke.  CT head was negative for acute abnormality. Patient denies passing out, no history of seizures. Denies chest pain or shortness of breath. Denies abdominal pain or dysuria.    Review of systems:    In addition to the HPI above,   All other systems reviewed and are negative.    Past History of the following :   Hypertension Hypothyroidism or   Social History:      Social History   Tobacco Use  . Smoking status: Not on file  Substance Use Topics  . Alcohol use: Not on file       Family History :   unremarkable   Home Medications:   Prior to Admission medications   Medication Sig Start Date End Date Taking? Authorizing Provider  acetaminophen (ARTHRITIS PAIN) 650 MG CR tablet Take 650 mg by mouth every 8 (eight) hours as needed for pain.   Yes [provider]  Carboxymethylcellulose Sodium (THERATEARS) 0.25 % SOLN Apply 1-2 drops to eye daily as needed (for irritation/dry eye).   Yes [provider]   levothyroxine (SYNTHROID) 100 MCG tablet Take 100 mcg by mouth every morning.  01/02/19  Yes [provider]  metoprolol succinate (TOPROL-XL) 50 MG 24 hr tablet Take 50 mg by mouth daily. 01/02/19  Yes [provider]  pantoprazole (PROTONIX) 40 MG tablet Take 40 mg by mouth daily. 03/20/19  Yes [provider]  simvastatin (ZOCOR) 20 MG tablet Take 20 mg by mouth every morning.  01/15/19  Yes [provider]     Allergies:    No Known Allergies   Physical Exam:   Vitals  Blood pressure (!) 149/85, pulse (!) 52, resp. rate (!) 28, height 5' (1.524 m), weight 69.4 kg, SpO2 (!) 84 %.  1.  General: Appears in no acute distress  2. Psychiatric: Alert, oriented x3, intact insight and judgment  3. Neurologic: Left-sided facial droop, dysarthria, motor strength 5/5 in all extremities, sensations are intact bilaterally  4. HEENMT:  Atraumatic normocephalic, extraocular muscles are intact  5. Respiratory : Clear to auscultation bilaterally  6. Cardiovascular :  S1-S2, regular, no murmur auscultated  7. Gastrointestinal:  Abdomen is soft, nontender, no organomegaly      Data Review:    CBC Recent Labs  Lab 03/25/19 1941  WBC 9.1  HGB 11.4*  HCT 37.6  PLT 241  MCV 90.2  MCH 27.3  MCHC 30.3  RDW 16.7*  LYMPHSABS 0.7  MONOABS 0.4  EOSABS 0.1  BASOSABS 0.0   ------------------------------------------------------------------------------------------------------------------  Results for orders placed or performed during the hospital encounter of 03/25/19 (from the past 48 hour(s))  CBG monitoring, ED     Status: Abnormal   Collection Time: 03/25/19  6:21 PM  Result Value Ref Range   Glucose-Capillary 172 (H) 70 - 99 mg/dL  Ethanol     Status: None   Collection Time: 03/25/19  7:41 PM  Result Value Ref Range   Alcohol, Ethyl (B) <10 <10 mg/dL    Comment: (NOTE) Lowest detectable limit for serum alcohol is 10 mg/dL. For medical  purposes only. Performed at [facility], [address], [place], [place] [address]   Protime-INR     Status: Abnormal   Collection Time: 03/25/19  7:41 PM  Result Value Ref Range   Prothrombin Time 15.4 (H) 11.4 - 15.2 seconds   INR 1.2 0.8 - 1.2    Comment: (NOTE) INR goal varies based on device and disease states. Performed at [facility], [address] [place], [place] [address]   APTT     Status: None   Collection Time: 03/25/19  7:41 PM  Result Value Ref Range   aPTT 32 24 - 36 seconds    Comment: Performed at [facility], [address], [place], [place] [address]  CBC     Status: Abnormal   Collection Time: 03/25/19  7:41 PM  Result Value Ref Range   WBC 9.1 4.0 - 10.5 K/uL   RBC 4.17 3.87 - 5.11 MIL/uL   Hemoglobin 11.4 (L) 12.0 - 15.0 g/dL   HCT 37.6 36.0 - 46.0 %   MCV 90.2 80.0 - 100.0 fL   MCH 27.3 26.0 - 34.0 pg   MCHC 30.3 30.0 - 36.0 g/dL   RDW 16.7 (H) 11.5 - 15.5 %   Platelets 241 150 - 400 K/uL   nRBC 0.0 0.0 - 0.2 %    Comment: Performed at [facility], [address] [place], [place] [address]  Differential     Status: Abnormal   Collection Time: 03/25/19  7:41 PM  Result Value Ref Range   Neutrophils Relative % 87 %   Neutro Abs 7.9 (H) 1.7 - 7.7 K/uL   Lymphocytes Relative 7 %   Lymphs Abs 0.7 0.7 - 4.0 K/uL   Monocytes Relative 5 %   Monocytes Absolute 0.4 0.1 - 1.0 K/uL   Eosinophils Relative 1 %   Eosinophils Absolute 0.1 0.0 - 0.5 K/uL   Basophils Relative 0 %   Basophils Absolute 0.0 0.0 - 0.1 K/uL   Immature Granulocytes 0 %   Abs Immature Granulocytes 0.03 0.00 - 0.07 K/uL    Comment: Performed at [facility], [address], [place], [place] [address]  Comprehensive metabolic panel     Status: Abnormal   Collection Time: 03/25/19  7:41 PM  Result Value Ref Range   Sodium 136 135 - 145 mmol/L   Potassium 3.6 3.5 - 5.1 mmol/L   Chloride 106 98 - 111 mmol/L   CO2 18 (L) 22 - 32 mmol/L   Glucose, Bld 151 (H) 70 -  99 mg/dL   BUN 21 8  - 23 mg/dL   Creatinine, Ser 0.16 (H) 0.44 - 1.00 mg/dL   Calcium 8.5 (L) 8.9 - 10.3 mg/dL   Total Protein 7.1 6.5 - 8.1 g/dL   Albumin 3.6 3.5 - 5.0 g/dL   AST 23 15 - 41 U/L   ALT 18 0 - 44 U/L   Alkaline Phosphatase 50 38 - 126 U/L   Total Bilirubin 0.6 0.3 - 1.2 mg/dL   GFR calc non Af Amer 41 (L) >60 mL/min   GFR calc Af Amer 47 (L) >60 mL/min   Anion gap 12 5 - 15    Comment: Performed at [facility], [address], [place], [place] [address]    Chemistries  Recent Labs  Lab 03/25/19 1941  NA 136  K 3.6  CL 106  CO2 18*  GLUCOSE 151*  BUN 21  CREATININE 1.19*  CALCIUM 8.5*  AST 23  ALT 18  ALKPHOS 50  BILITOT 0.6   ------------------------------------------------------------------------------------------------------------------  ------------------------------------------------------------------------------------------------------------------ GFR: Estimated Creatinine Clearance: 28.4 mL/min (A) (by C-G formula based on SCr of 1.19 mg/dL (H)). Liver Function Tests: Recent Labs  Lab 03/25/19 1941  AST 23  ALT 18  ALKPHOS 50  BILITOT 0.6  PROT 7.1  ALBUMIN 3.6   No results for input(s): LIPASE, AMYLASE in the last 168 hours. No results for input(s): AMMONIA in the last 168 hours. Coagulation Profile: Recent Labs  Lab 03/25/19 1941  INR 1.2   Cardiac Enzymes: No results for input(s): CKTOTAL, CKMB, CKMBINDEX, TROPONINI in the last 168 hours. BNP (last 3 results) No results for input(s): PROBNP in the last 8760 hours. HbA1C: No results for input(s): HGBA1C in the last 72 hours. CBG: Recent Labs  Lab 03/25/19 1821  GLUCAP 172*    --------------------------------------------------------------------------------------------------------------- Urine analysis:    Component Value Date/Time   COLORURINE YELLOW 02/03/2010 0915   APPEARANCEUR CLEAR 02/03/2010 0915   LABSPEC <1.005 (L) 02/03/2010 0915   PHURINE 5.5 02/03/2010 0915   GLUCOSEU NEGATIVE  02/03/2010 0915   HGBUR NEGATIVE 02/03/2010 0915   BILIRUBINUR NEGATIVE 02/03/2010 0915   KETONESUR NEGATIVE 02/03/2010 0915   PROTEINUR NEGATIVE 02/03/2010 0915   UROBILINOGEN 0.2 02/03/2010 0915   NITRITE NEGATIVE 02/03/2010 0915   LEUKOCYTESUR  02/03/2010 0915    NEGATIVE MICROSCOPIC NOT DONE ON URINES WITH NEGATIVE PROTEIN, BLOOD, LEUKOCYTES, NITRITE, OR GLUCOSE <1000 mg/dL.      Imaging Results:    Dg Chest Portable 1 View  Result Date: 03/25/2019 CLINICAL DATA:  Shortness of breath EXAM: PORTABLE CHEST 1 VIEW COMPARISON:  07/28/2008 FINDINGS: Cardiomegaly. Diffuse bilateral interstitial pulmonary opacity and layering bilateral pleural effusions. The visualized skeletal structures are unremarkable. IMPRESSION: 1.  Cardiomegaly. 2. Diffuse bilateral interstitial pulmonary opacity and layering bilateral pleural effusions, most consistent with edema. Electronically Signed   By: Lauralyn Primes M.D.   On: 03/25/2019 21:18   Ct Head Code Stroke Wo Contrast  Result Date: 03/25/2019 CLINICAL DATA:  Code stroke. Focal neuro deficit. Left facial droop today EXAM: CT HEAD WITHOUT CONTRAST TECHNIQUE: Contiguous axial images were obtained from the base of the skull through the vertex without intravenous contrast. COMPARISON:  CT head 07/28/2008 FINDINGS: Brain: Negative for acute cortical infarct. Negative for acute hemorrhage or mass. Progressive atrophy. Progressive changes throughout the white matter bilaterally which are symmetric and appear chronic. Vascular: Negative for hyperdense vessel Skull: Negative Sinuses/Orbits: Negative Other: None ASPECTS (Alberta Stroke Program Early CT Score) - Ganglionic level infarction (caudate, lentiform  nuclei, internal capsule, insula, M1-M3 cortex): 7 - Supraganglionic infarction (M4-M6 cortex): 3 Total score (0-10 with 10 being normal): 10 IMPRESSION: 1. No acute abnormality. 2. ASPECTS is 10 3. Progression of atrophy and white matter ischemia since 07/28/2008  4. These results were called by telephone at the time of interpretation on 03/25/2019 at 6:34 pm to provider JOSHUA LONG , who verbally acknowledged these results. Electronically Signed   By: Marlan Palau M.D.   On: 03/25/2019 18:34    My personal review of EKG: Rhythm-atrial fibrillation with RVR   Assessment & Plan:    Active Problems:   CVA (cerebral vascular accident) (HCC)   1. Acute right hemispheric infarct-patient presenting with left-sided facial droop and slurred speech, out of window for TPA.  CT head is unremarkable.  Will initiate stroke protocol, obtain MRI brain in a.m., CTA head and neck, hemoglobin A1c, lipid profile, echocardiogram.  Aspirin 325 mg PR.  Patient failed stroke swallow screen, will obtain speech therapy consult in a.m.  Continue Zocor 20 mg daily.  Will obtain neurology consultation in a.m.  2. New onset A. fib with RVR-patient started on low-dose Cardizem in the ED.  Will monitor closely in stepdown unit.  No anticoagulation recommended at this time due to stroke.CHA2DS2VASc score is at least 4.  Patient will need anticoagulation once cleared by neurology.  3. Hypothyroidism-patient takes Synthroid 100 mcg daily at home.  Will start IV levothyroxine 50 mcg daily.  Once patient is able to swallow she can be switched back to Synthroid.  4. Hypertension-hold Toprol-XL for permissive hypertension.   DVT Prophylaxis-   Lovenox   AM Labs Ordered, also please review Full Orders  Family Communication: Admission, patients condition and plan of care including tests being ordered have been discussed with the patient  who indicate understanding and agree with the plan and Code Status.  Code Status: Full code  Admission status: Inpatient: Based on patients clinical presentation and evaluation of above clinical data, I have made determination that patient meets Inpatient criteria at this time.  Time spent in minutes : 60 minutes   Meredeth Ide M.D on 03/26/2019  at 12:24 AM

## 2019-03-27 ENCOUNTER — Inpatient Hospital Stay (HOSPITAL_COMMUNITY): Payer: Medicare Other

## 2019-03-27 ENCOUNTER — Encounter (HOSPITAL_COMMUNITY): Payer: Self-pay

## 2019-03-27 DIAGNOSIS — I4891 Unspecified atrial fibrillation: Secondary | ICD-10-CM

## 2019-03-27 DIAGNOSIS — I639 Cerebral infarction, unspecified: Secondary | ICD-10-CM

## 2019-03-27 DIAGNOSIS — E038 Other specified hypothyroidism: Secondary | ICD-10-CM

## 2019-03-27 DIAGNOSIS — I480 Paroxysmal atrial fibrillation: Secondary | ICD-10-CM

## 2019-03-27 LAB — CBC
HCT: 38.5 % (ref 36.0–46.0)
Hemoglobin: 11.4 g/dL — ABNORMAL LOW (ref 12.0–15.0)
MCH: 27.2 pg (ref 26.0–34.0)
MCHC: 29.6 g/dL — ABNORMAL LOW (ref 30.0–36.0)
MCV: 91.9 fL (ref 80.0–100.0)
Platelets: 246 10*3/uL (ref 150–400)
RBC: 4.19 MIL/uL (ref 3.87–5.11)
RDW: 16.6 % — ABNORMAL HIGH (ref 11.5–15.5)
WBC: 7.9 10*3/uL (ref 4.0–10.5)
nRBC: 0 % (ref 0.0–0.2)

## 2019-03-27 LAB — BASIC METABOLIC PANEL
Anion gap: 10 (ref 5–15)
BUN: 16 mg/dL (ref 8–23)
CO2: 23 mmol/L (ref 22–32)
Calcium: 8.3 mg/dL — ABNORMAL LOW (ref 8.9–10.3)
Chloride: 108 mmol/L (ref 98–111)
Creatinine, Ser: 1.18 mg/dL — ABNORMAL HIGH (ref 0.44–1.00)
GFR calc Af Amer: 48 mL/min — ABNORMAL LOW (ref >60)
GFR calc non Af Amer: 41 mL/min — ABNORMAL LOW (ref >60)
Glucose, Bld: 90 mg/dL (ref 70–99)
Potassium: 3.1 mmol/L — ABNORMAL LOW (ref 3.5–5.1)
Sodium: 141 mmol/L (ref 135–145)

## 2019-03-27 LAB — MAGNESIUM: Magnesium: 1.9 mg/dL (ref 1.7–2.4)

## 2019-03-27 LAB — T4, FREE: Free T4: 1.05 ng/dL (ref 0.61–1.12)

## 2019-03-27 MED ORDER — POTASSIUM CHLORIDE 10 MEQ/100ML IV SOLN
10.0000 meq | INTRAVENOUS | Status: AC
  Administered 2019-03-27 (×4): 10 meq via INTRAVENOUS
  Filled 2019-03-27 (×4): qty 100

## 2019-03-27 MED ORDER — DILTIAZEM HCL 100 MG IV SOLR
INTRAVENOUS | Status: AC
  Administered 2019-03-27: 23:00:00
  Filled 2019-03-27: qty 100

## 2019-03-27 MED ORDER — FUROSEMIDE 10 MG/ML IJ SOLN
40.0000 mg | Freq: Once | INTRAMUSCULAR | Status: AC
  Administered 2019-03-27: 40 mg via INTRAVENOUS
  Filled 2019-03-27: qty 4

## 2019-03-27 NOTE — Progress Notes (Addendum)
PROGRESS NOTE  Becky Johnson:[id number] DOB: 1927/09/12 DOA: 03/25/2019 PCP: Karleen Hampshire, MD  Brief History:  85 year old female with a history of hypertension, hypothyroidism, anxiety presenting with dysarthria and left facial droop.  The patient was also noted to have a right gaze preference in the emergency department.  She was not felt to be a TPA candidate given the difficulty of identifying last known normal time. Teleneurology was consulted and recommended admission for further evaluation.  In addition, the patient was noted to have new onset atrial fibrillation with RVR.  She was started on diltiazem drip.  Cardiology was consulted to assist with management.  Neurology was also consulted.  MRI of the brain showed a moderate to large acute infarct in the right frontal lobe.  Neurology recommended continuing aspirin for 1 week before starting anticoagulation.  Assessment/Plan: Acute right frontal stroke -Appreciate Neurology Consult -PT/OT evaluation-->SNF -Speech therapy eval -CT brain--neg -CTA H&N--right ICA 50%, left ICA 70% stenosis.  Abrupt ROM for branch occlusion, likely embolic.  Large bilateral pleural effusions. -MRI brain--moderate to large acute infarct of right frontal lobe -Carotid Duplex--no hemodynamically significant stenosis -Echo--EF 60 to 65%, indeterminate diastolic function, no PFO.  Mild MR, trivial TRatri -LDL--69 -HbA1C--6.3 -Antiplatelet--aspirin 325 mg  Acute diastolic CHF -Repeat IV furosemide -neg 1.8 L with initial dose IV lasix -daily weights  Hypothyroidism -Continue IV Synthroid until able to tolerate po -TSH 9.143 -Continue current dose of Synthroid -Repeat TSH in 4 weeks  Dysphagia -Speech therapy consult appreciated -Currently remaining n.p.o. until reevaluation  Atrial fibrillation, new onset, type unspecified -Continue diltiazem drip until able to tolerate po -Continue aspirin for 1 week before switching  to anticoagulation per neurology recommendations  Hypokalemia -Replete -Magnesium 1.9  Impaired glucose tolerance -Hemoglobin A1c 6.3       Disposition Plan:   SNF in 1-2 days  Family Communication:   Granddaughter updated 10/21  Consultants:  cardiology  Code Status:  FULL  DVT Prophylaxis: [place] Lovenox   Procedures: As Listed in Progress Note Above  Antibiotics: None       Subjective: Patient denies fevers, chills, headache, chest pain, dyspnea, nausea, vomiting, diarrhea, abdominal pain, dysuria, hematuria, hematochezia, and melena.   Objective: Vitals:   03/27/19 0700 03/27/19 0800 03/27/19 0802 03/27/19 0842  BP: (!) 149/68 (!) 148/80    Pulse: 61 95    Resp: 16 20    Temp:   97.8 F (36.6 C)   TempSrc:   Oral   SpO2: 96% 97%  95%  Weight:      Height:        Intake/Output Summary (Last 24 hours) at 03/27/2019 0857 Last data filed at 03/27/2019 0600 Gross per 24 hour  Intake 1154.13 ml  Output 2950 ml  Net -1795.87 ml   Weight change: 0.4 kg Exam:   General:  Pt is alert, follows commands appropriately, not in acute distress  HEENT: No icterus, No thrush, No neck mass, [place]/AT  Cardiovascular: IRRR, S1/S2, no rubs, no gallops  Respiratory: bibasilar crackles. No wheeze  Abdomen: Soft/+BS, non tender, non distended, no guarding  Extremities: No edema, No lymphangitis, No petechiae, No rashes, no synovitis   Data Reviewed: I have personally reviewed following labs and imaging studies Basic Metabolic Panel: Recent Labs  Lab 03/25/19 1941 03/27/19 0417  NA 136 141  K 3.6 3.1*  CL 106 108  CO2 18* 23  GLUCOSE 151* 90  BUN 21 16  CREATININE 1.19* 1.18*  CALCIUM 8.5* 8.3*  MG  --  1.9   Liver Function Tests: Recent Labs  Lab 03/25/19 1941  AST 23  ALT 18  ALKPHOS 50  BILITOT 0.6  PROT 7.1  ALBUMIN 3.6   No results for input(s): LIPASE, AMYLASE in the last 168 hours. No results for input(s): AMMONIA in the last 168  hours. Coagulation Profile: Recent Labs  Lab 03/25/19 1941  INR 1.2   CBC: Recent Labs  Lab 03/25/19 1941 03/27/19 0417  WBC 9.1 7.9  NEUTROABS 7.9*  --   HGB 11.4* 11.4*  HCT 37.6 38.5  MCV 90.2 91.9  PLT 241 246   Cardiac Enzymes: No results for input(s): CKTOTAL, CKMB, CKMBINDEX, TROPONINI in the last 168 hours. BNP: Invalid input(s): POCBNP CBG: Recent Labs  Lab 03/25/19 1821  GLUCAP 172*   HbA1C: Recent Labs    03/25/19 1941  HGBA1C 6.3*   Urine analysis:    Component Value Date/Time   COLORURINE STRAW (A) 03/26/2019 0830   APPEARANCEUR CLEAR 03/26/2019 0830   LABSPEC 1.025 03/26/2019 0830   PHURINE 7.0 03/26/2019 0830   GLUCOSEU NEGATIVE 03/26/2019 0830   HGBUR NEGATIVE 03/26/2019 0830   BILIRUBINUR NEGATIVE 03/26/2019 0830   KETONESUR 5 (A) 03/26/2019 0830   PROTEINUR NEGATIVE 03/26/2019 0830   UROBILINOGEN 0.2 02/03/2010 0915   NITRITE NEGATIVE 03/26/2019 0830   LEUKOCYTESUR NEGATIVE 03/26/2019 0830   Sepsis Labs: (procalcitonin:4,lacticidven:4) ) Recent Results (from the past 240 hour(s))  SARS CORONAVIRUS 2 (Huston Stonehocker 6-24 HRS) Nasopharyngeal Nasopharyngeal Swab     Status: None   Collection Time: 03/25/19  7:38 PM   Specimen: Nasopharyngeal Swab  Result Value Ref Range Status   SARS Coronavirus 2 NEGATIVE NEGATIVE Final    Comment: (NOTE) SARS-CoV-2 target nucleic acids are NOT DETECTED. The SARS-CoV-2 RNA is generally detectable in upper and lower respiratory specimens during the acute phase of infection. Negative results do not preclude SARS-CoV-2 infection, do not rule out co-infections with other pathogens, and should not be used as the sole basis for treatment or other patient management decisions. Negative results must be combined with clinical observations, patient history, and epidemiological information. The expected result is Negative. Fact Sheet for Patients: HairSlick.no Fact Sheet for  Healthcare Providers: quierodirigir.com This test is not yet approved or cleared by the [place] FDA and  has been authorized for detection and/or diagnosis of SARS-CoV-2 by FDA under an Emergency Use Authorization (EUA). This EUA will remain  in effect (meaning this test can be used) for the duration of the COVID-19 declaration under Section 56 4(b)(1) of the Act, 21 U.S.C. section 360bbb-3(b)(1), unless the authorization is terminated or revoked sooner. Performed at [facility], [address], [place], [place] [address]   MRSA PCR Screening     Status: None   Collection Time: 03/26/19 10:49 AM   Specimen: Nasopharyngeal  Result Value Ref Range Status   MRSA by PCR NEGATIVE NEGATIVE Final    Comment:        The GeneXpert MRSA Assay (FDA approved for NASAL specimens only), is one component of a comprehensive MRSA colonization surveillance program. It is not intended to diagnose MRSA infection nor to guide or monitor treatment for MRSA infections. Performed at [facility], [address], [place], [place] [address]      Scheduled Meds:   stroke: mapping our early stages of recovery book   Does not apply Once   aspirin  300 mg Rectal Daily  Or   aspirin  325 mg Oral Daily   Chlorhexidine Gluconate Cloth  6 each Topical Daily   enoxaparin (LOVENOX) injection  30 mg Subcutaneous Q24H   levothyroxine  50 mcg Intravenous Daily   mouth rinse  15 mL Mouth Rinse BID   Continuous Infusions:  sodium chloride 75 mL/hr at 03/26/19 1509   diltiazem (CARDIZEM) infusion 5 mg/hr (03/26/19 1823)   potassium chloride      Procedures/Studies: Ct Angio Head W Or Wo Contrast  Result Date: 03/26/2019 CLINICAL DATA:  Initial evaluation for acute slurred speech, left-sided facial droop. EXAM: CT ANGIOGRAPHY HEAD AND NECK TECHNIQUE: Multidetector CT imaging of the head and neck was performed using the standard protocol during bolus  administration of intravenous contrast. Multiplanar CT image reconstructions and MIPs were obtained to evaluate the vascular anatomy. Carotid stenosis measurements (when applicable) are obtained utilizing NASCET criteria, using the distal internal carotid diameter as the denominator. CONTRAST:  75mL OMNIPAQUE IOHEXOL 350 MG/ML SOLN COMPARISON:  Prior head CT from 03/25/2019. FINDINGS: CTA NECK FINDINGS Aortic arch: Visualized aortic arch of normal caliber. Aberrant right subclavian artery noted. No hemodynamically significant stenosis seen about the origin of the great vessels. Right carotid system: Right CCA widely patent from its origin to the bifurcation without stenosis. Bulky plaque about the right bifurcation/proximal right ICA with associated stenosis of up to 50% by NASCET criteria. Right ICA widely patent distally to the skull base without stenosis, dissection, or occlusion. Left carotid system: Left CCA widely patent from its origin to the bifurcation. Bulky plaque about the left bifurcation/proximal left ICA with associated stenosis of up to 70% by NASCET criteria. Left ICA widely patent distally to the skull base without stenosis, dissection, or occlusion. Vertebral arteries: Both vertebral arteries arise from the subclavian arteries. Vertebral arteries widely patent within the neck without stenosis dissection or occlusion. Skeleton: No acute osseous finding. No discrete lytic or blastic osseous lesions. Mild cervical spondylosis at C5-6 and C6-7. Other neck: No other acute soft tissue abnormality within the neck. Thyroid is absent. No mass lesion or adenopathy. Upper chest: Enlarged prevascular node measures up to 1.7 cm, indeterminate. Large layering bilateral pleural effusions partially visualized. Associated dependent atelectasis. Hazy ground-glass opacity within the visualized lungs likely reflects a degree of pulmonary edema. Review of the MIP images confirms the above findings CTA HEAD FINDINGS  Anterior circulation: Petrous segments widely patent bilaterally. Scattered atheromatous plaque within the cavernous/supraclinoid ICAs with associated mild multifocal narrowing. No hemodynamically significant stenosis. ICA termini well perfused. A1 segments widely patent. Normal anterior communicating artery complex. Anterior cerebral arteries widely patent to their distal aspects without stenosis. M1 segments widely patent bilaterally. Normal MCA bifurcations. On the right, there is abrupt occlusion of a distal right M4 branch (series 7, image 73), likely embolic in nature. Distal MCA branches otherwise well perfused bilaterally and fairly symmetric. Posterior circulation: Vertebral arteries widely patent to the vertebrobasilar junction without stenosis. Posterior inferior cerebral arteries patent bilaterally. Basilar widely patent to its distal aspect without stenosis. Superior cerebral arteries patent bilaterally. Both of the posterior cerebral arteries primarily supplied via the basilar. Scattered atheromatous change with associated mild to moderate multifocal stenoses noted within the P2 segments bilaterally. PCAs are perfused to their distal aspects. Venous sinuses: Patent. Anatomic variants: None significant. Review of the MIP images confirms the above findings IMPRESSION: 1. Abrupt occlusion of a distal right M4 branch, likely embolic. 2. Atheromatous plaque about the carotid bifurcations bilaterally, with associated stenoses of up to 50%  on the right and 70% on the left. 3. Aberrant right subclavian artery. 4. Large layering bilateral pleural effusions with probable pulmonary interstitial edema, partially visualized. 5. Enlarged 1.7 cm mediastinal prevascular node, indeterminate, but could be reactive. Electronically Signed   By: Rise MuBenjamin  McClintock M.D.   On: 03/26/2019 03:32   Ct Angio Neck W Or Wo Contrast  Result Date: 03/26/2019 CLINICAL DATA:  Initial evaluation for acute slurred speech,  left-sided facial droop. EXAM: CT ANGIOGRAPHY HEAD AND NECK TECHNIQUE: Multidetector CT imaging of the head and neck was performed using the standard protocol during bolus administration of intravenous contrast. Multiplanar CT image reconstructions and MIPs were obtained to evaluate the vascular anatomy. Carotid stenosis measurements (when applicable) are obtained utilizing NASCET criteria, using the distal internal carotid diameter as the denominator. CONTRAST:  75mL OMNIPAQUE IOHEXOL 350 MG/ML SOLN COMPARISON:  Prior head CT from 03/25/2019. FINDINGS: CTA NECK FINDINGS Aortic arch: Visualized aortic arch of normal caliber. Aberrant right subclavian artery noted. No hemodynamically significant stenosis seen about the origin of the great vessels. Right carotid system: Right CCA widely patent from its origin to the bifurcation without stenosis. Bulky plaque about the right bifurcation/proximal right ICA with associated stenosis of up to 50% by NASCET criteria. Right ICA widely patent distally to the skull base without stenosis, dissection, or occlusion. Left carotid system: Left CCA widely patent from its origin to the bifurcation. Bulky plaque about the left bifurcation/proximal left ICA with associated stenosis of up to 70% by NASCET criteria. Left ICA widely patent distally to the skull base without stenosis, dissection, or occlusion. Vertebral arteries: Both vertebral arteries arise from the subclavian arteries. Vertebral arteries widely patent within the neck without stenosis dissection or occlusion. Skeleton: No acute osseous finding. No discrete lytic or blastic osseous lesions. Mild cervical spondylosis at C5-6 and C6-7. Other neck: No other acute soft tissue abnormality within the neck. Thyroid is absent. No mass lesion or adenopathy. Upper chest: Enlarged prevascular node measures up to 1.7 cm, indeterminate. Large layering bilateral pleural effusions partially visualized. Associated dependent atelectasis.  Hazy ground-glass opacity within the visualized lungs likely reflects a degree of pulmonary edema. Review of the MIP images confirms the above findings CTA HEAD FINDINGS Anterior circulation: Petrous segments widely patent bilaterally. Scattered atheromatous plaque within the cavernous/supraclinoid ICAs with associated mild multifocal narrowing. No hemodynamically significant stenosis. ICA termini well perfused. A1 segments widely patent. Normal anterior communicating artery complex. Anterior cerebral arteries widely patent to their distal aspects without stenosis. M1 segments widely patent bilaterally. Normal MCA bifurcations. On the right, there is abrupt occlusion of a distal right M4 branch (series 7, image 73), likely embolic in nature. Distal MCA branches otherwise well perfused bilaterally and fairly symmetric. Posterior circulation: Vertebral arteries widely patent to the vertebrobasilar junction without stenosis. Posterior inferior cerebral arteries patent bilaterally. Basilar widely patent to its distal aspect without stenosis. Superior cerebral arteries patent bilaterally. Both of the posterior cerebral arteries primarily supplied via the basilar. Scattered atheromatous change with associated mild to moderate multifocal stenoses noted within the P2 segments bilaterally. PCAs are perfused to their distal aspects. Venous sinuses: Patent. Anatomic variants: None significant. Review of the MIP images confirms the above findings IMPRESSION: 1. Abrupt occlusion of a distal right M4 branch, likely embolic. 2. Atheromatous plaque about the carotid bifurcations bilaterally, with associated stenoses of up to 50% on the right and 70% on the left. 3. Aberrant right subclavian artery. 4. Large layering bilateral pleural effusions with probable pulmonary interstitial edema,  partially visualized. 5. Enlarged 1.7 cm mediastinal prevascular node, indeterminate, but could be reactive. Electronically Signed   By: Rise Mu M.D.   On: 03/26/2019 03:32   Mr Brain Wo Contrast  Result Date: 03/26/2019 CLINICAL DATA:  Slurred speech and left-sided facial droop EXAM: MRI HEAD WITHOUT CONTRAST TECHNIQUE: Multiplanar, multiecho pulse sequences of the brain and surrounding structures were obtained without intravenous contrast. COMPARISON:  CTA head neck from earlier today FINDINGS: Brain: Moderate area of restricted diffusion in the lateral right frontal lobe primarily involving cortex but also nearly reaching the right lateral ventricle. No superimposed hemorrhage. There is a background of mild-to-moderate chronic small vessel ischemia in the cerebral white matter for age. Remote lacunar infarct in the left paramedian pons. Age congruent cerebral volume loss. No hydrocephalus, collection, or masslike finding Vascular: Normal flow voids Skull and upper cervical spine: Negative for marrow lesion Sinuses/Orbits: Negative IMPRESSION: 1. Moderate to large acute infarct in the right lateral frontal lobe. 2. Mild-to-moderate chronic small vessel ischemia for age. Electronically Signed   By: Marnee Spring M.D.   On: 03/26/2019 07:56   US Carotid Bilateral (at [facility] And Ap Only)  Result Date: 03/26/2019 CLINICAL DATA:  85 year old female with stroke-like symptoms EXAM: BILATERAL CAROTID DUPLEX ULTRASOUND TECHNIQUE: Wallace Cullens scale imaging, color Doppler and duplex ultrasound were performed of bilateral carotid and vertebral arteries in the neck. COMPARISON:  None. FINDINGS: Criteria: Quantification of carotid stenosis is based on velocity parameters that correlate the residual internal carotid diameter with NASCET-based stenosis levels, using the diameter of the distal internal carotid lumen as the denominator for stenosis measurement. The following velocity measurements were obtained: RIGHT ICA: 86/19 cm/sec CCA: 64/16 cm/sec SYSTOLIC ICA/CCA RATIO:  1.35 ECA:  108 cm/sec LEFT ICA: 129/23 cm/sec CCA: 76/11 cm/sec SYSTOLIC ICA/CCA  RATIO:  1.7 ECA:  155 cm/sec RIGHT CAROTID ARTERY: Moderate heterogeneous atherosclerotic plaque in the proximal internal carotid artery. By peak systolic velocity criteria, the estimated stenosis remains less than 50%. RIGHT VERTEBRAL ARTERY:  Patent with normal antegrade flow. LEFT CAROTID ARTERY: Moderate heterogeneous atherosclerotic plaque in the proximal internal carotid artery. By peak systolic velocity criteria, the estimated stenosis remains less than 50%. LEFT VERTEBRAL ARTERY:  Patent with normal antegrade flow. IMPRESSION: 1. Mild (1-49%) stenosis proximal right internal carotid artery secondary to moderate focal heterogeneous atherosclerotic plaque. 2. Mild (1-49%) stenosis proximal left internal carotid artery secondary to moderate focal heterogeneous atherosclerotic plaque. 3. The vertebral arteries are patent with normal antegrade flow. Signed, Sterling Big, MD, RPVI [facility] Electronically Signed   By: Malachy Moan M.D.   On: 03/26/2019 10:27   Dg Chest Portable 1 View  Result Date: 03/25/2019 CLINICAL DATA:  Shortness of breath EXAM: PORTABLE CHEST 1 VIEW COMPARISON:  07/28/2008 FINDINGS: Cardiomegaly. Diffuse bilateral interstitial pulmonary opacity and layering bilateral pleural effusions. The visualized skeletal structures are unremarkable. IMPRESSION: 1.  Cardiomegaly. 2. Diffuse bilateral interstitial pulmonary opacity and layering bilateral pleural effusions, most consistent with edema. Electronically Signed   By: Lauralyn Primes M.D.   On: 03/25/2019 21:18   Ct Head Code Stroke Wo Contrast  Result Date: 03/25/2019 CLINICAL DATA:  Code stroke. Focal neuro deficit. Left facial droop today EXAM: CT HEAD WITHOUT CONTRAST TECHNIQUE: Contiguous axial images were obtained from the base of the skull through the vertex without intravenous contrast. COMPARISON:  CT head 07/28/2008 FINDINGS: Brain: Negative for acute  cortical infarct. Negative for acute hemorrhage or mass. Progressive atrophy. Progressive changes  throughout the white matter bilaterally which are symmetric and appear chronic. Vascular: Negative for hyperdense vessel Skull: Negative Sinuses/Orbits: Negative Other: None ASPECTS ([place] Stroke Program Early CT Score) - Ganglionic level infarction (caudate, lentiform nuclei, internal capsule, insula, M1-M3 cortex): 7 - Supraganglionic infarction (M4-M6 cortex): 3 Total score (0-10 with 10 being normal): 10 IMPRESSION: 1. No acute abnormality. 2. ASPECTS is 10 3. Progression of atrophy and white matter ischemia since 07/28/2008 4. These results were called by telephone at the time of interpretation on 03/25/2019 at 6:34 pm to provider JOSHUA LONG , who verbally acknowledged these results. Electronically Signed   By: Franchot Gallo M.D.   On: 03/25/2019 18:34    Orson Eva, DO  [facility] Pager [phone]  If 7PM-7AM, please contact night-coverage [email or web address] Password [email or web address] 03/27/2019, 8:57 AM   LOS: 1 day

## 2019-03-27 NOTE — Progress Notes (Signed)
  Speech Language Pathology Treatment: Dysphagia  Patient Details Name: Becky Johnson MRN: [id number] DOB: 10/08/1927 Today's Date: 03/27/2019 Time: 2836-6294 SLP Time Calculation (min) (ACUTE ONLY): 19 min  Assessment / Plan / Recommendation Clinical Impression  Pt seen for ongoing diagnostic dysphagia intervention following BSE completed yesterday. Pt shows improved oral readiness and swallow feels palpable this date. Pt also with subjective improvement in swallow. Continue to suspect delay in swallow initiation. Pt assessed with ice chips, spoon sips of water, NTL, puree, and mech soft (limited). Pt with delayed cough after thin liquids and lingual residuals greater on the left side with mech soft. Will proceed with MBSS later this AM. Continue NPO for now, ok for po meds crushed as able in puree if necessary. Above to RN.    HPI HPI: Becky Johnson  is a 85 y.o. female, with history of hypertension, hypothyroidism was brought to the ED by private vehicle with acute onset slurred speech and left facial droop.  Patient says that she was last normal at 2:30 PM she called her nephew who ultimately saw her and transported her to ER, code stroke was activated from triage. In the ED tele neurology was consulted, patient was not found to be a TPA candidate as she was out of window for TPA.  Patient was found to be in A. fib with RVR, no anticoagulation recommended due to stroke.  CT head was negative for acute abnormality. MRI shows: Moderate to large acute infarct in the right lateral frontal lobe. Pt failed [employer] screen. BSE and SLE ordered      SLP Plan  MBS       Recommendations  Diet recommendations: NPO(pending MBSS today) Medication Administration: Crushed with puree                Oral Care Recommendations: Oral care prior to ice chip/H20;Staff/trained caregiver to provide oral care Follow up Recommendations: Skilled Nursing facility SLP Visit Diagnosis:  Dysphagia, oropharyngeal phase (R13.12) Plan: MBS       Thank you,  Genene Churn, [place]                 [place] 03/27/2019, 9:27 AM

## 2019-03-27 NOTE — Progress Notes (Signed)
Progress Note  Patient Name: Becky Johnson Date of Encounter: 03/27/2019  Primary Cardiologist: New, Dr Wyline MoodBranch  Subjective   SOB is improving but not resolved, still NPO  Inpatient Medications    Scheduled Meds:   stroke: mapping our early stages of recovery book   Does not apply Once   aspirin  300 mg Rectal Daily   Or   aspirin  325 mg Oral Daily   Chlorhexidine Gluconate Cloth  6 each Topical Daily   enoxaparin (LOVENOX) injection  30 mg Subcutaneous Q24H   levothyroxine  50 mcg Intravenous Daily   mouth rinse  15 mL Mouth Rinse BID   Continuous Infusions:  sodium chloride 75 mL/hr at 03/26/19 1509   diltiazem (CARDIZEM) infusion 5 mg/hr (03/26/19 1823)   PRN Meds: acetaminophen **OR** acetaminophen (TYLENOL) oral liquid 160 mg/5 mL **OR** acetaminophen   Vital Signs    Vitals:   03/27/19 0600 03/27/19 0630 03/27/19 0700 03/27/19 0802  BP: 127/60 138/65 (!) 149/68   Pulse: 61 (!) 57 61   Resp: 17 14 16    Temp:    97.8 F (36.6 C)  TempSrc:    Oral  SpO2: 96% 96% 96%   Weight:      Height:        Intake/Output Summary (Last 24 hours) at 03/27/2019 0810 Last data filed at 03/27/2019 0600 Gross per 24 hour  Intake 1154.13 ml  Output 2950 ml  Net -1795.87 ml   Last 3 Weights 03/27/2019 03/26/2019 03/25/2019  Weight (lbs) 149 lb 11.1 oz 153 lb 14.1 oz 153 lb  Weight (kg) 67.9 kg 69.8 kg 69.4 kg      Telemetry    PAF- Personally Reviewed  ECG    n/a - Personally Reviewed  Physical Exam   GEN: No acute distress.   Neck: No JVD Cardiac: irreg, no murmurs, rubs, or gallops.  Respiratory: Clear to auscultation bilaterally. GI: Soft, nontender, non-distended  MS: No edema; No deformity. Neuro:  Nonfocal  Psych: Normal affect   Labs    High Sensitivity Troponin:  No results for input(s): TROPONINIHS in the last 720 hours.    Chemistry Recent Labs  Lab 03/25/19 1941 03/27/19 0417  NA 136 141  K 3.6 3.1*  CL 106 108    CO2 18* 23  GLUCOSE 151* 90  BUN 21 16  CREATININE 1.19* 1.18*  CALCIUM 8.5* 8.3*  PROT 7.1  --   ALBUMIN 3.6  --   AST 23  --   ALT 18  --   ALKPHOS 50  --   BILITOT 0.6  --   GFRNONAA 41* 41*  GFRAA 47* 48*  ANIONGAP 12 10     Hematology Recent Labs  Lab 03/25/19 1941 03/27/19 0417  WBC 9.1 7.9  RBC 4.17 4.19  HGB 11.4* 11.4*  HCT 37.6 38.5  MCV 90.2 91.9  MCH 27.3 27.2  MCHC 30.3 29.6*  RDW 16.7* 16.6*  PLT 241 246    BNP Recent Labs  Lab 03/26/19 0515  BNP 344.0*     DDimer No results for input(s): DDIMER in the last 168 hours.   Radiology    Ct Angio Head W Or Wo Contrast  Result Date: 03/26/2019 CLINICAL DATA:  Initial evaluation for acute slurred speech, left-sided facial droop. EXAM: CT ANGIOGRAPHY HEAD AND NECK TECHNIQUE: Multidetector CT imaging of the head and neck was performed using the standard protocol during bolus administration of intravenous contrast. Multiplanar CT image reconstructions and MIPs  were obtained to evaluate the vascular anatomy. Carotid stenosis measurements (when applicable) are obtained utilizing NASCET criteria, using the distal internal carotid diameter as the denominator. CONTRAST:  39mL OMNIPAQUE IOHEXOL 350 MG/ML SOLN COMPARISON:  Prior head CT from 03/25/2019. FINDINGS: CTA NECK FINDINGS Aortic arch: Visualized aortic arch of normal caliber. Aberrant right subclavian artery noted. No hemodynamically significant stenosis seen about the origin of the great vessels. Right carotid system: Right CCA widely patent from its origin to the bifurcation without stenosis. Bulky plaque about the right bifurcation/proximal right ICA with associated stenosis of up to 50% by NASCET criteria. Right ICA widely patent distally to the skull base without stenosis, dissection, or occlusion. Left carotid system: Left CCA widely patent from its origin to the bifurcation. Bulky plaque about the left bifurcation/proximal left ICA with associated stenosis  of up to 70% by NASCET criteria. Left ICA widely patent distally to the skull base without stenosis, dissection, or occlusion. Vertebral arteries: Both vertebral arteries arise from the subclavian arteries. Vertebral arteries widely patent within the neck without stenosis dissection or occlusion. Skeleton: No acute osseous finding. No discrete lytic or blastic osseous lesions. Mild cervical spondylosis at C5-6 and C6-7. Other neck: No other acute soft tissue abnormality within the neck. Thyroid is absent. No mass lesion or adenopathy. Upper chest: Enlarged prevascular node measures up to 1.7 cm, indeterminate. Large layering bilateral pleural effusions partially visualized. Associated dependent atelectasis. Hazy ground-glass opacity within the visualized lungs likely reflects a degree of pulmonary edema. Review of the MIP images confirms the above findings CTA HEAD FINDINGS Anterior circulation: Petrous segments widely patent bilaterally. Scattered atheromatous plaque within the cavernous/supraclinoid ICAs with associated mild multifocal narrowing. No hemodynamically significant stenosis. ICA termini well perfused. A1 segments widely patent. Normal anterior communicating artery complex. Anterior cerebral arteries widely patent to their distal aspects without stenosis. M1 segments widely patent bilaterally. Normal MCA bifurcations. On the right, there is abrupt occlusion of a distal right M4 Starsky Nanna (series 7, image 73), likely embolic in nature. Distal MCA branches otherwise well perfused bilaterally and fairly symmetric. Posterior circulation: Vertebral arteries widely patent to the vertebrobasilar junction without stenosis. Posterior inferior cerebral arteries patent bilaterally. Basilar widely patent to its distal aspect without stenosis. Superior cerebral arteries patent bilaterally. Both of the posterior cerebral arteries primarily supplied via the basilar. Scattered atheromatous change with associated mild to  moderate multifocal stenoses noted within the P2 segments bilaterally. PCAs are perfused to their distal aspects. Venous sinuses: Patent. Anatomic variants: None significant. Review of the MIP images confirms the above findings IMPRESSION: 1. Abrupt occlusion of a distal right M4 Samah Lapiana, likely embolic. 2. Atheromatous plaque about the carotid bifurcations bilaterally, with associated stenoses of up to 50% on the right and 70% on the left. 3. Aberrant right subclavian artery. 4. Large layering bilateral pleural effusions with probable pulmonary interstitial edema, partially visualized. 5. Enlarged 1.7 cm mediastinal prevascular node, indeterminate, but could be reactive. Electronically Signed   By: Rise Mu M.D.   On: 03/26/2019 03:32   Ct Angio Neck W Or Wo Contrast  Result Date: 03/26/2019 CLINICAL DATA:  Initial evaluation for acute slurred speech, left-sided facial droop. EXAM: CT ANGIOGRAPHY HEAD AND NECK TECHNIQUE: Multidetector CT imaging of the head and neck was performed using the standard protocol during bolus administration of intravenous contrast. Multiplanar CT image reconstructions and MIPs were obtained to evaluate the vascular anatomy. Carotid stenosis measurements (when applicable) are obtained utilizing NASCET criteria, using the distal internal carotid diameter as  the denominator. CONTRAST:  48mL OMNIPAQUE IOHEXOL 350 MG/ML SOLN COMPARISON:  Prior head CT from 03/25/2019. FINDINGS: CTA NECK FINDINGS Aortic arch: Visualized aortic arch of normal caliber. Aberrant right subclavian artery noted. No hemodynamically significant stenosis seen about the origin of the great vessels. Right carotid system: Right CCA widely patent from its origin to the bifurcation without stenosis. Bulky plaque about the right bifurcation/proximal right ICA with associated stenosis of up to 50% by NASCET criteria. Right ICA widely patent distally to the skull base without stenosis, dissection, or occlusion.  Left carotid system: Left CCA widely patent from its origin to the bifurcation. Bulky plaque about the left bifurcation/proximal left ICA with associated stenosis of up to 70% by NASCET criteria. Left ICA widely patent distally to the skull base without stenosis, dissection, or occlusion. Vertebral arteries: Both vertebral arteries arise from the subclavian arteries. Vertebral arteries widely patent within the neck without stenosis dissection or occlusion. Skeleton: No acute osseous finding. No discrete lytic or blastic osseous lesions. Mild cervical spondylosis at C5-6 and C6-7. Other neck: No other acute soft tissue abnormality within the neck. Thyroid is absent. No mass lesion or adenopathy. Upper chest: Enlarged prevascular node measures up to 1.7 cm, indeterminate. Large layering bilateral pleural effusions partially visualized. Associated dependent atelectasis. Hazy ground-glass opacity within the visualized lungs likely reflects a degree of pulmonary edema. Review of the MIP images confirms the above findings CTA HEAD FINDINGS Anterior circulation: Petrous segments widely patent bilaterally. Scattered atheromatous plaque within the cavernous/supraclinoid ICAs with associated mild multifocal narrowing. No hemodynamically significant stenosis. ICA termini well perfused. A1 segments widely patent. Normal anterior communicating artery complex. Anterior cerebral arteries widely patent to their distal aspects without stenosis. M1 segments widely patent bilaterally. Normal MCA bifurcations. On the right, there is abrupt occlusion of a distal right M4 Citlally Captain (series 7, image 73), likely embolic in nature. Distal MCA branches otherwise well perfused bilaterally and fairly symmetric. Posterior circulation: Vertebral arteries widely patent to the vertebrobasilar junction without stenosis. Posterior inferior cerebral arteries patent bilaterally. Basilar widely patent to its distal aspect without stenosis. Superior  cerebral arteries patent bilaterally. Both of the posterior cerebral arteries primarily supplied via the basilar. Scattered atheromatous change with associated mild to moderate multifocal stenoses noted within the P2 segments bilaterally. PCAs are perfused to their distal aspects. Venous sinuses: Patent. Anatomic variants: None significant. Review of the MIP images confirms the above findings IMPRESSION: 1. Abrupt occlusion of a distal right M4 Allyana Vogan, likely embolic. 2. Atheromatous plaque about the carotid bifurcations bilaterally, with associated stenoses of up to 50% on the right and 70% on the left. 3. Aberrant right subclavian artery. 4. Large layering bilateral pleural effusions with probable pulmonary interstitial edema, partially visualized. 5. Enlarged 1.7 cm mediastinal prevascular node, indeterminate, but could be reactive. Electronically Signed   By: Jeannine Boga M.D.   On: 03/26/2019 03:32   Mr Brain Wo Contrast  Result Date: 03/26/2019 CLINICAL DATA:  Slurred speech and left-sided facial droop EXAM: MRI HEAD WITHOUT CONTRAST TECHNIQUE: Multiplanar, multiecho pulse sequences of the brain and surrounding structures were obtained without intravenous contrast. COMPARISON:  CTA head neck from earlier today FINDINGS: Brain: Moderate area of restricted diffusion in the lateral right frontal lobe primarily involving cortex but also nearly reaching the right lateral ventricle. No superimposed hemorrhage. There is a background of mild-to-moderate chronic small vessel ischemia in the cerebral white matter for age. Remote lacunar infarct in the left paramedian pons. Age congruent cerebral volume loss. No  hydrocephalus, collection, or masslike finding Vascular: Normal flow voids Skull and upper cervical spine: Negative for marrow lesion Sinuses/Orbits: Negative IMPRESSION: 1. Moderate to large acute infarct in the right lateral frontal lobe. 2. Mild-to-moderate chronic small vessel ischemia for age.  Electronically Signed   By: Marnee Spring M.D.   On: 03/26/2019 07:56   US Carotid Bilateral (at [facility] Only)  Result Date: 03/26/2019 CLINICAL DATA:  85 year old female with stroke-like symptoms EXAM: BILATERAL CAROTID DUPLEX ULTRASOUND TECHNIQUE: Wallace Cullens scale imaging, color Doppler and duplex ultrasound were performed of bilateral carotid and vertebral arteries in the neck. COMPARISON:  None. FINDINGS: Criteria: Quantification of carotid stenosis is based on velocity parameters that correlate the residual internal carotid diameter with NASCET-based stenosis levels, using the diameter of the distal internal carotid lumen as the denominator for stenosis measurement. The following velocity measurements were obtained: RIGHT ICA: 86/19 cm/sec CCA: 64/16 cm/sec SYSTOLIC ICA/CCA RATIO:  1.35 ECA:  108 cm/sec LEFT ICA: 129/23 cm/sec CCA: 76/11 cm/sec SYSTOLIC ICA/CCA RATIO:  1.7 ECA:  155 cm/sec RIGHT CAROTID ARTERY: Moderate heterogeneous atherosclerotic plaque in the proximal internal carotid artery. By peak systolic velocity criteria, the estimated stenosis remains less than 50%. RIGHT VERTEBRAL ARTERY:  Patent with normal antegrade flow. LEFT CAROTID ARTERY: Moderate heterogeneous atherosclerotic plaque in the proximal internal carotid artery. By peak systolic velocity criteria, the estimated stenosis remains less than 50%. LEFT VERTEBRAL ARTERY:  Patent with normal antegrade flow. IMPRESSION: 1. Mild (1-49%) stenosis proximal right internal carotid artery secondary to moderate focal heterogeneous atherosclerotic plaque. 2. Mild (1-49%) stenosis proximal left internal carotid artery secondary to moderate focal heterogeneous atherosclerotic plaque. 3. The vertebral arteries are patent with normal antegrade flow. Signed, Sterling Big, MD, RPVI [facility]iology Electronically Signed   By: Malachy Moan M.D.   On: 03/26/2019 10:27   Dg Chest  Portable 1 View  Result Date: 03/25/2019 CLINICAL DATA:  Shortness of breath EXAM: PORTABLE CHEST 1 VIEW COMPARISON:  07/28/2008 FINDINGS: Cardiomegaly. Diffuse bilateral interstitial pulmonary opacity and layering bilateral pleural effusions. The visualized skeletal structures are unremarkable. IMPRESSION: 1.  Cardiomegaly. 2. Diffuse bilateral interstitial pulmonary opacity and layering bilateral pleural effusions, most consistent with edema. Electronically Signed   By: Lauralyn Primes M.D.   On: 03/25/2019 21:18   Ct Head Code Stroke Wo Contrast  Result Date: 03/25/2019 CLINICAL DATA:  Code stroke. Focal neuro deficit. Left facial droop today EXAM: CT HEAD WITHOUT CONTRAST TECHNIQUE: Contiguous axial images were obtained from the base of the skull through the vertex without intravenous contrast. COMPARISON:  CT head 07/28/2008 FINDINGS: Brain: Negative for acute cortical infarct. Negative for acute hemorrhage or mass. Progressive atrophy. Progressive changes throughout the white matter bilaterally which are symmetric and appear chronic. Vascular: Negative for hyperdense vessel Skull: Negative Sinuses/Orbits: Negative Other: None ASPECTS (Alberta Stroke Program Early CT Score) - Ganglionic level infarction (caudate, lentiform nuclei, internal capsule, insula, M1-M3 cortex): 7 - Supraganglionic infarction (M4-M6 cortex): 3 Total score (0-10 with 10 being normal): 10 IMPRESSION: 1. No acute abnormality. 2. ASPECTS is 10 3. Progression of atrophy and white matter ischemia since 07/28/2008 4. These results were called by telephone at the time of interpretation on 03/25/2019 at 6:34 pm to provider JOSHUA LONG , who verbally acknowledged these results. Electronically Signed   By: Marlan Palau M.D.   On: 03/25/2019 18:34    Cardiac Studies     Patient Profile     85 y.o. female Becky Johnson is a 85 y.o. female with past medical history of HTN, HLD and Hypothyroidism who is being seen today for  the evaluation of atrial fibrillation with RVR at the request of Dr. Laural Benes.  Assessment & Plan    1. CVA - management per primary team - possibly embolic, new diagnosis of afib this admission   2. Afib - new diagnosis this admission - has been started on IV diltiazem, she has had swallowing issues related to stroke and remains NPO. Continue IV dilt until can convert to oral, would start on oral dilt  bid and titrate as needed - would start eliquis  bid when ok from neuro standpoint given recent CVA  3. Pulmonary edema - noted on CXR, she had some SOB/DOE over the last few days. BNP 344 - given IV lasix  x 1 yesterday. Negative 1.8 liters, stable renal function - echo LVEF 60-65%, indet diastolic function. Suspect likely diastolic HF exacerbated by afib with RVR - some ongoing SOB, redose IV lasix  x 1 today. I think likely will not require further diuresis after this.   4. Hypokalemia - I have written for IV today   We will follow peripherlaly tomorrow, review tele and review her PO status     For questions or updates, please contact [facility] Please consult [email or web address] for contact info under        Signed, Dina Rich, MD  03/27/2019, 8:10 AM

## 2019-03-27 NOTE — Progress Notes (Signed)
Physical Therapy Treatment Patient Details Name: Becky Johnson MRN: [id number] DOB: 1928/04/20 Today's Date: 03/27/2019    History of Present Illness Becky Johnson is a 85 y.o. female with past medical history reviewed below presents to the emergency department by private vehicle with acute onset slurred speech and left facial droop.  Patient states that she was last normal at 2:30 PM.  She called her nephew who ultimately saw her and transported her to the emergency department where code stroke was activated from triage.  Patient denies any numbness.  She is not having any headache, chest pain, palpitations, shortness of breath.  She denies being on blood thinners.  No radiation of symptoms or other modifying factors.    PT Comments    Patient limited for functional mobility as stated below secondary to BLE weakness, fatigue and poor standing/sitting  Balance. Patient continues to require moderate assist to seated but is able to use BUE more with verbal cueing for sequencing to assist. She is able to perform therex seated at bedside but has c/o fatigue following exercises performed and wishes to get back into bed. Vitals are monitored throughout session and HR fluctuated frequently from 90s-140 bpm.   Patient will benefit from continued physical therapy in hospital and recommended venue below to increase strength, balance, endurance for safe ADLs and gait.    Follow Up Recommendations  SNF     Equipment Recommendations  Rolling walker with 5" wheels    Recommendations for Other Services       Precautions / Restrictions Precautions Precautions: Fall Restrictions Weight Bearing Restrictions: No    Mobility  Bed Mobility Overal bed mobility: Needs Assistance Bed Mobility: Supine to Sit     Supine to sit: Mod assist     General bed mobility comments: requires mod assist to transition to seated at bedside, feet feel "asleep" when seated which she states is  normal  Transfers                    Ambulation/Gait                 Stairs             Wheelchair Mobility    Modified Rankin (Stroke Patients Only)       Balance Overall balance assessment: Needs assistance Sitting-balance support: Bilateral upper extremity supported Sitting balance-Leahy Scale: Fair Sitting balance - Comments: at bedside, feet could not reach floor                                    Cognition Arousal/Alertness: Awake/alert Behavior During Therapy: WFL for tasks assessed/performed;Flat affect Overall Cognitive Status: Within Functional Limits for tasks assessed                                        Exercises General Exercises - Lower Extremity Ankle Circles/Pumps: AROM;Both;15 reps;Seated Long Arc Quad: AROM;Seated;Both;15 reps Hip Flexion/Marching: AROM;Seated;Both;15 reps    General Comments        Pertinent Vitals/Pain Pain Assessment: No/denies pain    Home Living                      Prior Function            PT Goals (current goals can now be found  in the care plan section) Acute Rehab PT Goals Patient Stated Goal: Return home after rehab PT Goal Formulation: With patient Time For Goal Achievement: 04/09/19 Potential to Achieve Goals: Fair Progress towards PT goals: Progressing toward goals    Frequency    7X/week      PT Plan Current plan remains appropriate    Co-evaluation              AM-PAC PT "6 Clicks" Mobility   Outcome Measure  Help needed turning from your back to your side while in a flat bed without using bedrails?: A Lot Help needed moving from lying on your back to sitting on the side of a flat bed without using bedrails?: A Lot Help needed moving to and from a bed to a chair (including a wheelchair)?: A Lot Help needed standing up from a chair using your arms (e.g., wheelchair or bedside chair)?: A Lot Help needed to walk in hospital  room?: A Lot Help needed climbing 3-5 steps with a railing? : A Lot 6 Click Score: 12    End of Session   Activity Tolerance: Patient tolerated treatment well;Patient limited by fatigue Patient left: in bed;with call bell/phone within reach;with bed alarm set Nurse Communication: Mobility status PT Visit Diagnosis: Unsteadiness on feet (R26.81);Other abnormalities of gait and mobility (R26.89);Muscle weakness (generalized) (M62.81);Other symptoms and signs involving the nervous system (R29.898)     Time: 9629-5284 PT Time Calculation (min) (ACUTE ONLY): 24 min  Charges:  $Therapeutic Exercise: 8-22 mins $Therapeutic Activity: 8-22 mins                     3:47 PM, 03/27/19 Wyman Songster PT, DPT Physical Therapist at [facility]

## 2019-03-27 NOTE — NC FL2 (Signed)
[place] MEDICAID FL2 LEVEL OF CARE SCREENING TOOL     IDENTIFICATION  Patient Name: Becky Johnson Birthdate: 1927-10-02 Sex: female Admission Date (Current Location): 03/25/2019  [facility] and [place] Number:  [employer] and Address:  [facility],  [address], [place] [address]      Provider Number: [id number]  Attending Physician Name and Address:  Catarina Hartshorn, MD  Relative Name and Phone Number:       Current Level of Care: Hospital Recommended Level of Care: Skilled Nursing Facility Prior Approval Number:    Date Approved/Denied:   PASRR Number: [id number]  Discharge Plan: SNF    Current Diagnoses: Patient Active Problem List   Diagnosis Date Noted  . Unspecified atrial fibrillation (HCC) 03/27/2019  . Acute embolic stroke (HCC) 03/27/2019  . CVA (cerebral vascular accident) (HCC) 03/26/2019  . SPINAL STENOSIS, LUMBAR 10/07/2009  . SCIATICA 09/28/2009  . Hypothyroidism 12/02/2008  . ANXIETY 12/02/2008  . HYPERTENSION, UNSPECIFIED 12/02/2008  . ABNORMAL EKG 12/02/2008    Orientation RESPIRATION BLADDER Height & Weight     Self, Time, Situation, Place  O2 External catheter Weight: 67.9 kg Height:  5\' 2"  (157.5 cm)  BEHAVIORAL SYMPTOMS/MOOD NEUROLOGICAL BOWEL NUTRITION STATUS      Continent Diet(see DC summary)  AMBULATORY STATUS COMMUNICATION OF NEEDS Skin   Extensive Assist Verbally Normal                       Personal Care Assistance Level of Assistance  Bathing, Feeding, Dressing Bathing Assistance: Limited assistance Feeding assistance: Independent Dressing Assistance: Limited assistance     Functional Limitations Info  Sight, Hearing, Speech Sight Info: Adequate Hearing Info: Adequate Speech Info: Adequate    SPECIAL CARE FACTORS FREQUENCY  PT (By licensed PT), OT (By licensed OT)     PT Frequency: 5x/week; OT Frequency: 3x/week            Contractures Contractures Info: Not present     Additional Factors Info  Code Status, Allergies Code Status Info: Full Allergies Info: NKA           Current Medications (03/27/2019):  This is the current hospital active medication list Current Facility-Administered Medications  Medication Dose Route Frequency Provider Last Rate Last Dose  .  stroke: mapping our early stages of recovery book   Does not apply Once 03/29/2019, [place], MD      . acetaminophen (TYLENOL) tablet 650 mg  650 mg Oral Q4H PRN Sarina Ill, MD       Or  . acetaminophen (TYLENOL) solution 650 mg  650 mg Per Tube Q4H PRN Meredeth Ide, MD       Or  . acetaminophen (TYLENOL) suppository 650 mg  650 mg Rectal Q4H PRN Meredeth Ide, MD      . aspirin suppository 300 mg  300 mg Rectal Daily Meredeth Ide, MD   300 mg at 03/27/19 03/29/19   Or  . aspirin tablet 325 mg  325 mg Oral Daily [id number], MD      . Chlorhexidine Gluconate Cloth 2 % PADS 6 each  6 each Topical Daily Johnson, Clanford L, MD   6 each at 03/27/19 1000  . diltiazem (CARDIZEM) 125 mg in dextrose 5% 125 mL (1 mg/mL) infusion  5-15 mg/hr Intravenous Titrated 03/29/19, [place] Ouch M, PA-C 2.5 mL/hr at 03/27/19 1100 2.5 mg/hr at 03/27/19 1100  . enoxaparin (LOVENOX) injection 30 mg  30 mg  Subcutaneous Q24H Wynetta Emery, Clanford L, MD   30 mg at 03/26/19 2031  . levothyroxine (SYNTHROID, LEVOTHROID) injection 50 mcg  50 mcg Intravenous Daily Oswald Hillock, MD   50 mcg at 03/27/19 7322  . MEDLINE mouth rinse  15 mL Mouth Rinse BID Johnson, Clanford L, MD   15 mL at 03/27/19 0930  . potassium chloride 10 mEq in 100 mL IVPB  10 mEq Intravenous Q1 Hr x 4 Arnoldo Lenis, MD 100 mL/hr at 03/27/19 1212 10 mEq at 03/27/19 1212     Discharge Medications: Please see discharge summary for a list of discharge medications.  Relevant Imaging Results:  Relevant Lab Results:   Additional Information SSN [place]  Tameeka Luo, Chauncey Reading, RN

## 2019-03-27 NOTE — TOC Initial Note (Signed)
Transition of Care [facility]) - Initial/Assessment Note    Patient Details  Name: Becky Johnson MRN: [id number] Date of Birth: 1928-04-19  Transition of [facility]) CM/SW Contact:    Sayed Apostol, Chrystine Oiler, RN Phone Number: 03/27/2019, 12:28 PM  Clinical Narrative:    Acute right hemispheric infarct-patient presenting with left-sided facial droop and slurred speech. From home, lives independently. Has family support. Recommended for SNF, agreeable to SNF search. Will also call and discuss with grand daughter, Shanda Bumps.   Referrals sent. Will discuss bed offers with patient when available.                Expected Discharge Plan: Skilled Nursing Facility Barriers to Discharge: Continued Medical Work up, SNF Pending bed offer   Patient Goals and CMS Choice Patient states their goals for this hospitalization and ongoing recovery are:: patient is agreeable to SNF but ultimatley would like to go home CMS Medicare.gov Compare Post Acute Care list provided to:: Patient Choice offered to / list presented to : Patient  Expected Discharge Plan and Services Expected Discharge Plan: Skilled Nursing Facility   Discharge Planning Services: CM Consult   Living arrangements for the past 2 months: Single Family Home                                      Prior Living Arrangements/Services Living arrangements for the past 2 months: Single Family Home Lives with:: Self                   Activities of Daily Living Home Assistive Devices/Equipment: Cane (specify quad or straight) ADL Screening (condition at time of admission) Patient's cognitive ability adequate to safely complete daily activities?: Yes Is the patient deaf or have difficulty hearing?: No Does the patient have difficulty seeing, even when wearing glasses/contacts?: No Does the patient have difficulty concentrating, remembering, or making decisions?: Yes Patient able to express need for assistance with ADLs?:  Yes Does the patient have difficulty dressing or bathing?: No Independently performs ADLs?: No Communication: Independent Dressing (OT): Needs assistance Is this a change from baseline?: Change from baseline, expected to last <3days Grooming: Needs assistance Is this a change from baseline?: Change from baseline, expected to last <3 days Feeding: Needs assistance Is this a change from baseline?: Change from baseline, expected to last <3 days Bathing: Needs assistance Is this a change from baseline?: Change from baseline, expected to last <3 days Toileting: Needs assistance Is this a change from baseline?: Change from baseline, expected to last <3 days In/Out Bed: Needs assistance Is this a change from baseline?: Change from baseline, expected to last <3 days Walks in Home: Needs assistance Is this a change from baseline?: Change from baseline, expected to last <3 days Does the patient have difficulty walking or climbing stairs?: Yes Weakness of Legs: Both Weakness of Arms/Hands: Both  Permission Sought/Granted                  Emotional Assessment       Orientation: : Oriented to Self, Oriented to Place, Oriented to  Time Alcohol / Substance Use: Not Applicable Psych Involvement: No (comment)  Admission diagnosis:  Stroke (cerebrum) (HCC) [I63.9] Cerebrovascular accident (CVA), unspecified mechanism (HCC) [I63.9] Atrial fibrillation, unspecified type [facility]) [I48.91] Patient Active Problem List   Diagnosis Date Noted  . Unspecified atrial fibrillation (HCC) 03/27/2019  . Acute embolic stroke (HCC) 03/27/2019  .  CVA (cerebral vascular accident) ([place]) 03/26/2019  . SPINAL STENOSIS, LUMBAR 10/07/2009  . SCIATICA 09/28/2009  . Hypothyroidism 12/02/2008  . ANXIETY 12/02/2008  . HYPERTENSION, UNSPECIFIED 12/02/2008  . ABNORMAL EKG 12/02/2008   PCP:  Elsie Lincoln, MD Pharmacy:   [place], [place] - [place] [address] [place] [address]  [place] [address] Phone: [phone] Fax: [phone]     Social Determinants of Health (SDOH) Interventions    Readmission Risk Interventions No flowsheet data found.

## 2019-03-27 NOTE — Evaluation (Signed)
Occupational Therapy Evaluation Patient Details Name: Becky Johnson MRN: [id number] DOB: 11-30-1927 Today's Date: 03/27/2019    History of Present Illness Becky Johnson is a 85 y.o. female with past medical history reviewed below presents to the emergency department by private vehicle with acute onset slurred speech and left facial droop.  Patient states that she was last normal at 2:30 PM.  She called her nephew who ultimately saw her and transported her to the emergency department where code stroke was activated from triage.  Patient denies any numbness.  She is not having any headache, chest pain, palpitations, shortness of breath.  She denies being on blood thinners.  No radiation of symptoms or other modifying factors.   Clinical Impression   Pt agreeable to OT evaluation this am. Pt reporting her speech is improving and she is feeling somewhat better. Pt demonstrates BUE strength WFL, coordination and sensation are intact. Pt primarily limiting during ADLs due to generalized weakness and poor activity tolerance. Pt is able to complete UB ADLs in sitting with set-up, unable to complete standing tasks due to fatigue. Recommend SNF on discharge to improve safety and independence in ADLs and increase strength and activity tolerance required for participation in ADLs.     Follow Up Recommendations  SNF    Equipment Recommendations  None recommended by OT       Precautions / Restrictions Precautions Precautions: Fall Restrictions Weight Bearing Restrictions: No      Mobility Bed Mobility Overal bed mobility: Needs Assistance             General bed mobility comments: requires min assist for scooting to [facility], using bed rails and pushing herself up with feet.   Transfers                 General transfer comment: not completed, defer to PT note        ADL either performed or assessed with clinical judgement   ADL Overall ADL's : Needs  assistance/impaired     Grooming: Wash/dry hands;Wash/dry face;Modified independent;Sitting Grooming Details (indicate cue type and reason): Pt unable to stand at sink for tasks due to weakness and poor activity tolerance         Upper Body Dressing : Minimal assistance;Sitting Upper Body Dressing Details (indicate cue type and reason): assist for managing gown ties and lines Lower Body Dressing: Maximal assistance;Sitting/lateral leans;Bed level Lower Body Dressing Details (indicate cue type and reason): assist due to weakness               General ADL Comments: pt with generalized weakness limiting ADL participation     Vision Baseline Vision/History: Wears glasses Wears Glasses: At all times Patient Visual Report: No change from baseline Vision Assessment?: Yes Eye Alignment: Within Functional Limits Ocular Range of Motion: Within Functional Limits Alignment/Gaze Preference: Within Defined Limits Tracking/Visual Pursuits: Decreased smoothness of horizontal tracking Saccades: Within functional limits Convergence: Within functional limits Visual Fields: No apparent deficits            Pertinent Vitals/Pain Pain Assessment: No/denies pain     Hand Dominance Right   Extremity/Trunk Assessment Upper Extremity Assessment Upper Extremity Assessment: Overall WFL for tasks assessed(grossly 4+/5 throughout BUE)   Lower Extremity Assessment Lower Extremity Assessment: Defer to PT evaluation       Communication Communication Communication: Expressive difficulties   Cognition Arousal/Alertness: Awake/alert Behavior During Therapy: WFL for tasks assessed/performed;Flat affect Overall Cognitive Status: Within Functional Limits for tasks assessed  Home Living Family/patient expects to be discharged to:: Private residence Living Arrangements: Alone Available Help at Discharge:  Family;Friend(s);Available PRN/intermittently Type of Home: Apartment Home Access: Level entry     Home Layout: One level     Bathroom Shower/Tub: Chief Strategy Officer: Standard     Home Equipment: Environmental consultant - 4 wheels;Cane - single point          Prior Functioning/Environment Level of Independence: Needs assistance  Gait / Transfers Assistance Needed: household ambulator with AD ADL's / Homemaking Assistance Needed: Independent in ADLs. Family friends take her to the store as pt does not drive            OT Problem List: Decreased strength;Decreased activity tolerance;Impaired balance (sitting and/or standing)       End of Session Equipment Utilized During Treatment: Oxygen  Activity Tolerance: Patient tolerated treatment well Patient left: in bed;with call bell/phone within reach;with nursing/sitter in room  OT Visit Diagnosis: Muscle weakness (generalized) (M62.81)                Time: 9030-0923 OT Time Calculation (min): 17 min Charges:  OT General Charges $OT Visit: 1 Visit OT Evaluation $OT Eval Low Complexity: 1 Low  Ezra Sites, OTR/L  [phone] 03/27/2019, 8:04 AM

## 2019-03-27 NOTE — Progress Notes (Signed)
Modified Barium Swallow Progress Note  Patient Details  Name: Becky Johnson MRN: [id number] Date of Birth: [birth year]  Today's Date: 03/27/2019  Modified Barium Swallow completed.  Full report located under Chart Review in the Imaging Section.  Brief recommendations include the following:  Clinical Impression  Pt presents with moderate oropharyngeal phase dysphagia in setting of acute CVA characterized by reduced lingual strength and sensation resulting in lingual pumping, reduced bolus cohesiveness, and premature spillage with liquids; pharyngeal phase is marked by delay in swallow initiation, reduced tongue base retraction and epiglottic deflection, and reduced laryngeal closure resulting in penetration and aspiration of tsp thins (between arytenoids down posterior tracheal wall) and tsp/cup sips NTL with delayed and ineffective cough response (aspirate was not removed). Pt with min vallecular residue which necessitated repeat/dry swallows to clear. Recommend D1/puree and HTL via tsp or cup sips with 100% supervision, encourage small bites,sips and throat clearing periodically, po meds whole in puree. Suspect prognosis for upgrades are good with improved Pt strength and time. Pt is at risk for aspiration given the above deficits and would benefit from f/u dysphagia intervention in acute and SNF with controlled trials of NTL and mech soft. Above to RN. SLP will follow in acute.    Swallow Evaluation Recommendations       SLP Diet Recommendations: Dysphagia 1 (Puree) solids;Honey thick liquids   Liquid Administration via: Cup   Medication Administration: Whole meds with puree   Supervision: Patient able to self feed;Staff to assist with self feeding;Full supervision/cueing for compensatory strategies   Compensations: Slow rate;Small sips/bites;Multiple dry swallows after each bite/sip;Clear throat intermittently   Postural Changes: Remain semi-upright after after feeds/meals  (Comment);Seated upright at 90 degrees   Oral Care Recommendations: Oral care BID;Staff/trained caregiver to provide oral care   Other Recommendations: Order thickener from pharmacy;Prohibited food (jello, ice cream, thin soups);Clarify dietary restrictions   Thank you,  Genene Churn, [place]  [place] 03/27/2019,1:01 PM

## 2019-03-28 DIAGNOSIS — J9601 Acute respiratory failure with hypoxia: Secondary | ICD-10-CM

## 2019-03-28 LAB — CBC
HCT: 38.4 % (ref 36.0–46.0)
Hemoglobin: 11.7 g/dL — ABNORMAL LOW (ref 12.0–15.0)
MCH: 27.4 pg (ref 26.0–34.0)
MCHC: 30.5 g/dL (ref 30.0–36.0)
MCV: 89.9 fL (ref 80.0–100.0)
Platelets: 277 10*3/uL (ref 150–400)
RBC: 4.27 MIL/uL (ref 3.87–5.11)
RDW: 16.6 % — ABNORMAL HIGH (ref 11.5–15.5)
WBC: 10.6 10*3/uL — ABNORMAL HIGH (ref 4.0–10.5)
nRBC: 0 % (ref 0.0–0.2)

## 2019-03-28 LAB — BASIC METABOLIC PANEL
Anion gap: 14 (ref 5–15)
BUN: 21 mg/dL (ref 8–23)
CO2: 22 mmol/L (ref 22–32)
Calcium: 8.6 mg/dL — ABNORMAL LOW (ref 8.9–10.3)
Chloride: 104 mmol/L (ref 98–111)
Creatinine, Ser: 1.24 mg/dL — ABNORMAL HIGH (ref 0.44–1.00)
GFR calc Af Amer: 45 mL/min — ABNORMAL LOW (ref >60)
GFR calc non Af Amer: 39 mL/min — ABNORMAL LOW (ref >60)
Glucose, Bld: 101 mg/dL — ABNORMAL HIGH (ref 70–99)
Potassium: 3.4 mmol/L — ABNORMAL LOW (ref 3.5–5.1)
Sodium: 140 mmol/L (ref 135–145)

## 2019-03-28 LAB — MAGNESIUM: Magnesium: 1.9 mg/dL (ref 1.7–2.4)

## 2019-03-28 MED ORDER — METOPROLOL SUCCINATE ER 50 MG PO TB24
50.0000 mg | ORAL_TABLET | Freq: Every day | ORAL | Status: DC
  Administered 2019-03-28 – 2019-03-29 (×2): 50 mg via ORAL
  Filled 2019-03-28 (×2): qty 1

## 2019-03-28 MED ORDER — FUROSEMIDE 20 MG PO TABS
20.0000 mg | ORAL_TABLET | Freq: Every day | ORAL | Status: DC
  Administered 2019-03-28 – 2019-03-29 (×2): 20 mg via ORAL
  Filled 2019-03-28 (×2): qty 1

## 2019-03-28 MED ORDER — POTASSIUM CHLORIDE CRYS ER 20 MEQ PO TBCR
20.0000 meq | EXTENDED_RELEASE_TABLET | Freq: Once | ORAL | Status: AC
  Administered 2019-03-28: 20 meq via ORAL
  Filled 2019-03-28: qty 1

## 2019-03-28 MED FILL — Dextrose Inj 5%: INTRAVENOUS | Qty: 100 | Status: AC

## 2019-03-28 MED FILL — Diltiazem HCl IV Soln 125 MG/25ML (5 MG/ML): INTRAVENOUS | Qty: 125 | Status: AC

## 2019-03-28 NOTE — Progress Notes (Signed)
PROGRESS NOTE  Becky Johnson ZOX:[id number] DOB: [birth year] DOA: 03/25/2019 PCP: Karleen Hampshire, MD  Brief History:  85 year old female with a history of hypertension, hypothyroidism, anxiety presenting with dysarthria and left facial droop.  The patient was also noted to have a right gaze preference in the emergency department.  She was not felt to be a TPA candidate given the difficulty of identifying last known normal time. Teleneurology was consulted and recommended admission for further evaluation.  In addition, the patient was noted to have new onset atrial fibrillation with RVR.  She was started on diltiazem drip.  Cardiology was consulted to assist with management.  Neurology was also consulted.  MRI of the brain showed a moderate to large acute infarct in the right frontal lobe.  Neurology recommended continuing aspirin for 1 week before starting anticoagulation.  Assessment/Plan: Acute right frontal stroke -Appreciate Neurology Consult -PT/OT evaluation-->SNF -Speech therapy eval -CT brain--neg -CTA H&N--right ICA 50%, left ICA 70% stenosis.  Abrupt ROM for branch occlusion, likely embolic.  Large bilateral pleural effusions. -MRI brain--moderate to large acute infarct of right frontal lobe -Carotid Duplex--no hemodynamically significant stenosis -Echo--EF 60 to 65%, indeterminate diastolic function, no PFO.  Mild MR, trivial TRatri -LDL--69 -HbA1C--6.3 -Antiplatelet--aspirin 325 mg  Acute diastolic CHF -Received IV furosemide x 2 doses -start po lasix -neg 1.8 L with initial dose IV lasix -daily weights  Acute Respiratory failure with hypoxia -initially on 4L -due to pleural effusion -now weaned to RA  Hypothyroidism -Continue IV Synthroid until able to tolerate po -TSH 9.143; Free T4--1.05 -Continue current dose of Synthroid -Repeat TSH in 4 weeks  Dysphagia -Speech therapy consult appreciated -dysphagia I with honey thickened  liquids  Atrial fibrillation, new onset, type unspecified - diltiazem drip>>>transitioned back to po metoprolol -Continue aspirin for 1 week before switching to anticoagulation per neurology recommendations  Hypokalemia -Replete -Magnesium 1.9  Impaired glucose tolerance -Hemoglobin A1c 6.3       Disposition Plan:   SNF 10/23 if stable Family Communication:   Granddaughter updated 10/21  Consultants:  cardiology  Code Status:  FULL  DVT Prophylaxis:  Lovenox   Procedures: As Listed in Progress Note Above  Antibiotics: None    Subjective: Patient denies fevers, chills, headache, chest pain, dyspnea, nausea, vomiting, diarrhea, abdominal pain, dysuria, hematuria, hematochezia, and melena.   Objective: Vitals:   03/28/19 1400 03/28/19 1500 03/28/19 1600 03/28/19 1624  BP: (!) 166/69 (!) 147/105 (!) 133/46   Pulse: 66 70 60   Resp: (!) Temp:    97.8 F (36.6 C)  TempSrc:    Oral  SpO2: 92% 90% 93%   Weight:      Height:        Intake/Output Summary (Last 24 hours) at 03/28/2019 1638 Last data filed at 03/28/2019 1030 Gross per 24 hour  Intake 133.9 ml  Output --  Net 133.9 ml   Weight change: 0 kg Exam:   General:  Pt is alert, follows commands appropriately, not in acute distress  HEENT: No icterus, No thrush, No neck mass, [place]/AT  Cardiovascular: RRR, S1/S2, no rubs, no gallops  Respiratory: bibasilar crackles, no wheeze  Abdomen: Soft/+BS, non tender, non distended, no guarding  Extremities: No edema, No lymphangitis, No petechiae, No rashes, no synovitis   Data Reviewed: I have personally reviewed following labs and imaging studies Basic Metabolic Panel: Recent Labs  Lab 03/25/19 1941 03/27/19 0417 03/28/19 8119  NA 136 141 140  K 3.6 3.1* 3.4*  CL 106 108 104  CO2 18* 23 22  GLUCOSE 151* 90 101*  BUN 21 16 21   CREATININE 1.19* 1.18* 1.24*  CALCIUM 8.5* 8.3* 8.6*  MG  --  1.9 1.9   Liver  Function Tests: Recent Labs  Lab 03/25/19 1941  AST 23  ALT 18  ALKPHOS 50  BILITOT 0.6  PROT 7.1  ALBUMIN 3.6   No results for input(s): LIPASE, AMYLASE in the last 168 hours. No results for input(s): AMMONIA in the last 168 hours. Coagulation Profile: Recent Labs  Lab 03/25/19 1941  INR 1.2   CBC: Recent Labs  Lab 03/25/19 1941 03/27/19 0417 03/28/19 0412  WBC 9.1 7.9 10.6*  NEUTROABS 7.9*  --   --   HGB 11.4* 11.4* 11.7*  HCT 37.6 38.5 38.4  MCV 90.2 91.9 89.9  PLT 241 246 277   Cardiac Enzymes: No results for input(s): CKTOTAL, CKMB, CKMBINDEX, TROPONINI in the last 168 hours. BNP: Invalid input(s): POCBNP CBG: Recent Labs  Lab 03/25/19 1821  GLUCAP 172*   HbA1C: Recent Labs    03/25/19 1941  HGBA1C 6.3*   Urine analysis:    Component Value Date/Time   COLORURINE STRAW (A) 03/26/2019 0830   APPEARANCEUR CLEAR 03/26/2019 0830   LABSPEC 1.025 03/26/2019 0830   PHURINE 7.0 03/26/2019 0830   GLUCOSEU NEGATIVE 03/26/2019 0830   HGBUR NEGATIVE 03/26/2019 0830   BILIRUBINUR NEGATIVE 03/26/2019 0830   KETONESUR 5 (A) 03/26/2019 0830   PROTEINUR NEGATIVE 03/26/2019 0830   UROBILINOGEN 0.2 02/03/2010 0915   NITRITE NEGATIVE 03/26/2019 0830   LEUKOCYTESUR NEGATIVE 03/26/2019 0830   Sepsis Labs: @LABRCNTIP (procalcitonin:4,lacticidven:4) ) Recent Results (from the past 240 hour(s))  SARS CORONAVIRUS 2 (Kemiah Booz 6-24 HRS) Nasopharyngeal Nasopharyngeal Swab     Status: None   Collection Time: 03/25/19  7:38 PM   Specimen: Nasopharyngeal Swab  Result Value Ref Range Status   SARS Coronavirus 2 NEGATIVE NEGATIVE Final    Comment: (NOTE) SARS-CoV-2 target nucleic acids are NOT DETECTED. The SARS-CoV-2 RNA is generally detectable in upper and lower respiratory specimens during the acute phase of infection. Negative results do not preclude SARS-CoV-2 infection, do not rule out co-infections with other pathogens, and should not be used as the sole basis for  treatment or other patient management decisions. Negative results must be combined with clinical observations, patient history, and epidemiological information. The expected result is Negative. Fact Sheet for Patients: SugarRoll.be Fact Sheet for Healthcare Providers: https://www.woods-mathews.com/ This test is not yet approved or cleared by the [place] FDA and  has been authorized for detection and/or diagnosis of SARS-CoV-2 by FDA under an Emergency Use Authorization (EUA). This EUA will remain  in effect (meaning this test can be used) for the duration of the COVID-19 declaration under Section 56 4(b)(1) of the Act, 21 U.S.C. section 360bbb-3(b)(1), unless the authorization is terminated or revoked sooner. Performed at [facility], [place] [address] [place], [place] [address]   MRSA PCR Screening     Status: None   Collection Time: 03/26/19 10:49 AM   Specimen: Nasopharyngeal  Result Value Ref Range Status   MRSA by PCR NEGATIVE NEGATIVE Final    Comment:        The GeneXpert MRSA Assay (FDA approved for NASAL specimens only), is one component of a comprehensive MRSA colonization surveillance program. It is not intended to diagnose MRSA infection nor to guide or monitor treatment for MRSA infections.  Performed at [facility], [address] [place], [place] [address]      Scheduled Meds:   stroke: mapping our early stages of recovery book   Does not apply Once   aspirin  300 mg Rectal Daily   Or   aspirin  325 mg Oral Daily   Chlorhexidine Gluconate Cloth  6 each Topical Daily   enoxaparin (LOVENOX) injection  30 mg Subcutaneous Q24H   levothyroxine  50 mcg Intravenous Daily   mouth rinse  15 mL Mouth Rinse BID   metoprolol succinate  50 mg Oral Daily   Continuous Infusions:  Procedures/Studies: Ct Angio Head W Or Wo Contrast  Result Date: 03/26/2019 CLINICAL DATA:  Initial evaluation for acute  slurred speech, left-sided facial droop. EXAM: CT ANGIOGRAPHY HEAD AND NECK TECHNIQUE: Multidetector CT imaging of the head and neck was performed using the standard protocol during bolus administration of intravenous contrast. Multiplanar CT image reconstructions and MIPs were obtained to evaluate the vascular anatomy. Carotid stenosis measurements (when applicable) are obtained utilizing NASCET criteria, using the distal internal carotid diameter as the denominator. CONTRAST:  75mL OMNIPAQUE IOHEXOL 350 MG/ML SOLN COMPARISON:  Prior head CT from 03/25/2019. FINDINGS: CTA NECK FINDINGS Aortic arch: Visualized aortic arch of normal caliber. Aberrant right subclavian artery noted. No hemodynamically significant stenosis seen about the origin of the great vessels. Right carotid system: Right CCA widely patent from its origin to the bifurcation without stenosis. Bulky plaque about the right bifurcation/proximal right ICA with associated stenosis of up to 50% by NASCET criteria. Right ICA widely patent distally to the skull base without stenosis, dissection, or occlusion. Left carotid system: Left CCA widely patent from its origin to the bifurcation. Bulky plaque about the left bifurcation/proximal left ICA with associated stenosis of up to 70% by NASCET criteria. Left ICA widely patent distally to the skull base without stenosis, dissection, or occlusion. Vertebral arteries: Both vertebral arteries arise from the subclavian arteries. Vertebral arteries widely patent within the neck without stenosis dissection or occlusion. Skeleton: No acute osseous finding. No discrete lytic or blastic osseous lesions. Mild cervical spondylosis at C5-6 and C6-7. Other neck: No other acute soft tissue abnormality within the neck. Thyroid is absent. No mass lesion or adenopathy. Upper chest: Enlarged prevascular node measures up to 1.7 cm, indeterminate. Large layering bilateral pleural effusions partially visualized. Associated  dependent atelectasis. Hazy ground-glass opacity within the visualized lungs likely reflects a degree of pulmonary edema. Review of the MIP images confirms the above findings CTA HEAD FINDINGS Anterior circulation: Petrous segments widely patent bilaterally. Scattered atheromatous plaque within the cavernous/supraclinoid ICAs with associated mild multifocal narrowing. No hemodynamically significant stenosis. ICA termini well perfused. A1 segments widely patent. Normal anterior communicating artery complex. Anterior cerebral arteries widely patent to their distal aspects without stenosis. M1 segments widely patent bilaterally. Normal MCA bifurcations. On the right, there is abrupt occlusion of a distal right M4 branch (series 7, image 73), likely embolic in nature. Distal MCA branches otherwise well perfused bilaterally and fairly symmetric. Posterior circulation: Vertebral arteries widely patent to the vertebrobasilar junction without stenosis. Posterior inferior cerebral arteries patent bilaterally. Basilar widely patent to its distal aspect without stenosis. Superior cerebral arteries patent bilaterally. Both of the posterior cerebral arteries primarily supplied via the basilar. Scattered atheromatous change with associated mild to moderate multifocal stenoses noted within the P2 segments bilaterally. PCAs are perfused to their distal aspects. Venous sinuses: Patent. Anatomic variants: None significant. Review of the MIP images confirms the above  findings IMPRESSION: 1. Abrupt occlusion of a distal right M4 branch, likely embolic. 2. Atheromatous plaque about the carotid bifurcations bilaterally, with associated stenoses of up to 50% on the right and 70% on the left. 3. Aberrant right subclavian artery. 4. Large layering bilateral pleural effusions with probable pulmonary interstitial edema, partially visualized. 5. Enlarged 1.7 cm mediastinal prevascular node, indeterminate, but could be reactive. Electronically  Signed   By: Rise MuBenjamin  McClintock M.D.   On: 03/26/2019 03:32   Ct Angio Neck W Or Wo Contrast  Result Date: 03/26/2019 CLINICAL DATA:  Initial evaluation for acute slurred speech, left-sided facial droop. EXAM: CT ANGIOGRAPHY HEAD AND NECK TECHNIQUE: Multidetector CT imaging of the head and neck was performed using the standard protocol during bolus administration of intravenous contrast. Multiplanar CT image reconstructions and MIPs were obtained to evaluate the vascular anatomy. Carotid stenosis measurements (when applicable) are obtained utilizing NASCET criteria, using the distal internal carotid diameter as the denominator. CONTRAST:  75mL OMNIPAQUE IOHEXOL 350 MG/ML SOLN COMPARISON:  Prior head CT from 03/25/2019. FINDINGS: CTA NECK FINDINGS Aortic arch: Visualized aortic arch of normal caliber. Aberrant right subclavian artery noted. No hemodynamically significant stenosis seen about the origin of the great vessels. Right carotid system: Right CCA widely patent from its origin to the bifurcation without stenosis. Bulky plaque about the right bifurcation/proximal right ICA with associated stenosis of up to 50% by NASCET criteria. Right ICA widely patent distally to the skull base without stenosis, dissection, or occlusion. Left carotid system: Left CCA widely patent from its origin to the bifurcation. Bulky plaque about the left bifurcation/proximal left ICA with associated stenosis of up to 70% by NASCET criteria. Left ICA widely patent distally to the skull base without stenosis, dissection, or occlusion. Vertebral arteries: Both vertebral arteries arise from the subclavian arteries. Vertebral arteries widely patent within the neck without stenosis dissection or occlusion. Skeleton: No acute osseous finding. No discrete lytic or blastic osseous lesions. Mild cervical spondylosis at C5-6 and C6-7. Other neck: No other acute soft tissue abnormality within the neck. Thyroid is absent. No mass lesion or  adenopathy. Upper chest: Enlarged prevascular node measures up to 1.7 cm, indeterminate. Large layering bilateral pleural effusions partially visualized. Associated dependent atelectasis. Hazy ground-glass opacity within the visualized lungs likely reflects a degree of pulmonary edema. Review of the MIP images confirms the above findings CTA HEAD FINDINGS Anterior circulation: Petrous segments widely patent bilaterally. Scattered atheromatous plaque within the cavernous/supraclinoid ICAs with associated mild multifocal narrowing. No hemodynamically significant stenosis. ICA termini well perfused. A1 segments widely patent. Normal anterior communicating artery complex. Anterior cerebral arteries widely patent to their distal aspects without stenosis. M1 segments widely patent bilaterally. Normal MCA bifurcations. On the right, there is abrupt occlusion of a distal right M4 branch (series 7, image 73), likely embolic in nature. Distal MCA branches otherwise well perfused bilaterally and fairly symmetric. Posterior circulation: Vertebral arteries widely patent to the vertebrobasilar junction without stenosis. Posterior inferior cerebral arteries patent bilaterally. Basilar widely patent to its distal aspect without stenosis. Superior cerebral arteries patent bilaterally. Both of the posterior cerebral arteries primarily supplied via the basilar. Scattered atheromatous change with associated mild to moderate multifocal stenoses noted within the P2 segments bilaterally. PCAs are perfused to their distal aspects. Venous sinuses: Patent. Anatomic variants: None significant. Review of the MIP images confirms the above findings IMPRESSION: 1. Abrupt occlusion of a distal right M4 branch, likely embolic. 2. Atheromatous plaque about the carotid bifurcations bilaterally, with associated stenoses  of up to 50% on the right and 70% on the left. 3. Aberrant right subclavian artery. 4. Large layering bilateral pleural effusions  with probable pulmonary interstitial edema, partially visualized. 5. Enlarged 1.7 cm mediastinal prevascular node, indeterminate, but could be reactive. Electronically Signed   By: Rise Mu M.D.   On: 03/26/2019 03:32   Mr Brain Wo Contrast  Result Date: 03/26/2019 CLINICAL DATA:  Slurred speech and left-sided facial droop EXAM: MRI HEAD WITHOUT CONTRAST TECHNIQUE: Multiplanar, multiecho pulse sequences of the brain and surrounding structures were obtained without intravenous contrast. COMPARISON:  CTA head neck from earlier today FINDINGS: Brain: Moderate area of restricted diffusion in the lateral right frontal lobe primarily involving cortex but also nearly reaching the right lateral ventricle. No superimposed hemorrhage. There is a background of mild-to-moderate chronic small vessel ischemia in the cerebral white matter for age. Remote lacunar infarct in the left paramedian pons. Age congruent cerebral volume loss. No hydrocephalus, collection, or masslike finding Vascular: Normal flow voids Skull and upper cervical spine: Negative for marrow lesion Sinuses/Orbits: Negative IMPRESSION: 1. Moderate to large acute infarct in the right lateral frontal lobe. 2. Mild-to-moderate chronic small vessel ischemia for age. Electronically Signed   By: Marnee Spring M.D.   On: 03/26/2019 07:56   US Carotid Bilateral (at [facility] And Ap Only)  Result Date: 03/26/2019 CLINICAL DATA:  85 year old female with stroke-like symptoms EXAM: BILATERAL CAROTID DUPLEX ULTRASOUND TECHNIQUE: Wallace Cullens scale imaging, color Doppler and duplex ultrasound were performed of bilateral carotid and vertebral arteries in the neck. COMPARISON:  None. FINDINGS: Criteria: Quantification of carotid stenosis is based on velocity parameters that correlate the residual internal carotid diameter with NASCET-based stenosis levels, using the diameter of the distal internal carotid lumen as the denominator for stenosis measurement. The  following velocity measurements were obtained: RIGHT ICA: 86/19 cm/sec CCA: 64/16 cm/sec SYSTOLIC ICA/CCA RATIO:  1.35 ECA:  108 cm/sec LEFT ICA: 129/23 cm/sec CCA: 76/11 cm/sec SYSTOLIC ICA/CCA RATIO:  1.7 ECA:  155 cm/sec RIGHT CAROTID ARTERY: Moderate heterogeneous atherosclerotic plaque in the proximal internal carotid artery. By peak systolic velocity criteria, the estimated stenosis remains less than 50%. RIGHT VERTEBRAL ARTERY:  Patent with normal antegrade flow. LEFT CAROTID ARTERY: Moderate heterogeneous atherosclerotic plaque in the proximal internal carotid artery. By peak systolic velocity criteria, the estimated stenosis remains less than 50%. LEFT VERTEBRAL ARTERY:  Patent with normal antegrade flow. IMPRESSION: 1. Mild (1-49%) stenosis proximal right internal carotid artery secondary to moderate focal heterogeneous atherosclerotic plaque. 2. Mild (1-49%) stenosis proximal left internal carotid artery secondary to moderate focal heterogeneous atherosclerotic plaque. 3. The vertebral arteries are patent with normal antegrade flow. Signed, Sterling Big, MD, RPVI [facility] Electronically Signed   By: Malachy Moan M.D.   On: 03/26/2019 10:27   Dg Chest Portable 1 View  Result Date: 03/25/2019 CLINICAL DATA:  Shortness of breath EXAM: PORTABLE CHEST 1 VIEW COMPARISON:  07/28/2008 FINDINGS: Cardiomegaly. Diffuse bilateral interstitial pulmonary opacity and layering bilateral pleural effusions. The visualized skeletal structures are unremarkable. IMPRESSION: 1.  Cardiomegaly. 2. Diffuse bilateral interstitial pulmonary opacity and layering bilateral pleural effusions, most consistent with edema. Electronically Signed   By: Lauralyn Primes M.D.   On: 03/25/2019 21:18   Dg Swallowing Func-speech Pathology  Result Date: 03/27/2019 Objective Swallowing Evaluation: Type of Study: MBS-Modified Barium Swallow Study  Patient Details Name:  SAPNA PADRON MRN: [id number] Date of Birth: January 14, 1928 Today's Date: 03/27/2019 Time: SLP Start Time (ACUTE  ONLY): 1138 -SLP Stop Time (ACUTE ONLY): 1210 SLP Time Calculation (min) (ACUTE ONLY): 32 min Past Medical History: Past Medical History: Diagnosis Date  Essential hypertension   Hypothyroidism  Past Surgical History: No past surgical history on file. HPI: Lavren Lewan  is a 85 y.o. female, with history of hypertension, hypothyroidism was brought to the ED by private vehicle with acute onset slurred speech and left facial droop.  Patient says that she was last normal at 2:30 PM she called her nephew who ultimately saw her and transported her to ER, code stroke was activated from triage. In the ED tele neurology was consulted, patient was not found to be a TPA candidate as she was out of window for TPA.  Patient was found to be in A. fib with RVR, no anticoagulation recommended due to stroke.  CT head was negative for acute abnormality. MRI shows: Moderate to large acute infarct in the right lateral frontal lobe. Pt failed [employer] screen. BSE and SLE ordered  Subjective: "I live by myself." Assessment / Plan / Recommendation CHL IP CLINICAL IMPRESSIONS 03/27/2019 Clinical Impression Pt presents with moderate oropharyngeal phase dysphagia in setting of acute CVA characterized by reduced lingual strength and sensation resulting in lingual pumping, reduced bolus cohesiveness, and premature spillage with liquids; pharyngeal phase is marked by delay in swallow initiation, reduced tongue base retraction and epiglottic deflection, and reduced laryngeal closure resulting in penetration and aspiration of tsp thins (between arytenoids down posterior tracheal wall) and tsp/cup sips NTL with delayed and ineffective cough response (aspirate was not removed). Pt with min vallecular residue which necessitated repeat/dry swallows to clear. Recommend D1/puree and HTL via tsp or cup sips with 100%  supervision, encourage small bites,sips and throat clearing periodically, po meds whole in puree. Suspect prognosis for upgrades are good with improved Pt strength and time. Pt is at risk for aspiration given the above deficits and would benefit from f/u dysphagia intervention in acute and SNF with controlled trials of NTL and mech soft. Above to RN. SLP will follow in acute.  SLP Visit Diagnosis Dysphagia, oropharyngeal phase (R13.12) Attention and concentration deficit following -- Frontal lobe and executive function deficit following -- Impact on safety and function Moderate aspiration risk;Risk for inadequate nutrition/hydration   CHL IP TREATMENT RECOMMENDATION 03/27/2019 Treatment Recommendations Therapy as outlined in treatment plan below   Prognosis 03/27/2019 Prognosis for Safe Diet Advancement Fair Barriers to Reach Goals Severity of deficits Barriers/Prognosis Comment -- CHL IP DIET RECOMMENDATION 03/27/2019 SLP Diet Recommendations Dysphagia 1 (Puree) solids;Honey thick liquids Liquid Administration via Cup Medication Administration Whole meds with puree Compensations Slow rate;Small sips/bites;Multiple dry swallows after each bite/sip;Clear throat intermittently Postural Changes Remain semi-upright after after feeds/meals (Comment);Seated upright at 90 degrees   CHL IP OTHER RECOMMENDATIONS 03/27/2019 Recommended Consults -- Oral Care Recommendations Oral care BID;Staff/trained caregiver to provide oral care Other Recommendations Order thickener from pharmacy;Prohibited food (jello, ice cream, thin soups);Clarify dietary restrictions   CHL IP FOLLOW UP RECOMMENDATIONS 03/27/2019 Follow up Recommendations Skilled Nursing facility   [facility] IP FREQUENCY AND DURATION 03/27/2019 Speech Therapy Frequency (ACUTE ONLY) min 2x/week Treatment Duration 1 week      CHL IP ORAL PHASE 03/27/2019 Oral Phase Impaired Oral - Pudding Teaspoon -- Oral - Pudding Cup -- Oral - Honey Teaspoon Decreased bolus  cohesion;Lingual/palatal residue Oral - Honey Cup Premature spillage;Piecemeal swallowing;Decreased bolus cohesion Oral - Nectar Teaspoon Premature spillage;Decreased bolus cohesion;Lingual/palatal residue Oral - Nectar Cup Premature spillage;Decreased bolus cohesion;Lingual/palatal residue;Piecemeal swallowing;Lingual pumping Oral -  Nectar Straw -- Oral - Thin Teaspoon Premature spillage;Decreased bolus cohesion;Lingual pumping Oral - Thin Cup -- Oral - Thin Straw -- Oral - Puree Lingual pumping;Reduced posterior propulsion;Piecemeal swallowing;Delayed oral transit;Decreased bolus cohesion Oral - Mech Soft Lingual pumping;Weak lingual manipulation;Piecemeal swallowing;Delayed oral transit;Decreased bolus cohesion Oral - Regular -- Oral - Multi-Consistency -- Oral - Pill WFL Oral Phase - Comment --  CHL IP PHARYNGEAL PHASE 03/27/2019 Pharyngeal Phase Impaired Pharyngeal- Pudding Teaspoon -- Pharyngeal -- Pharyngeal- Pudding Cup -- Pharyngeal -- Pharyngeal- Honey Teaspoon Delayed swallow initiation-pyriform sinuses;Delayed swallow initiation-vallecula;Reduced epiglottic inversion;Reduced tongue base retraction;Pharyngeal residue - valleculae;Pharyngeal residue - pyriform Pharyngeal -- Pharyngeal- Honey Cup Delayed swallow initiation-pyriform sinuses;Reduced tongue base retraction;Pharyngeal residue - valleculae;Pharyngeal residue - pyriform Pharyngeal -- Pharyngeal- Nectar Teaspoon Delayed swallow initiation-vallecula;Reduced epiglottic inversion;Reduced tongue base retraction;Pharyngeal residue - valleculae;Pharyngeal residue - pyriform Pharyngeal -- Pharyngeal- Nectar Cup Delayed swallow initiation-pyriform sinuses;Reduced airway/laryngeal closure;Reduced epiglottic inversion;Reduced tongue base retraction;Penetration/Aspiration before swallow;Penetration/Aspiration during swallow;Trace aspiration;Moderate aspiration;Pharyngeal residue - valleculae;Pharyngeal residue - pyriform Pharyngeal Material enters airway,  passes BELOW cords without attempt by patient to eject out (silent aspiration) Pharyngeal- Nectar Straw -- Pharyngeal -- Pharyngeal- Thin Teaspoon Delayed swallow initiation-pyriform sinuses;Reduced epiglottic inversion;Reduced airway/laryngeal closure;Reduced tongue base retraction;Penetration/Aspiration before swallow;Penetration/Aspiration during swallow;Trace aspiration;Pharyngeal residue - valleculae;Pharyngeal residue - pyriform Pharyngeal Material enters airway, passes BELOW cords and not ejected out despite cough attempt by patient Pharyngeal- Thin Cup -- Pharyngeal -- Pharyngeal- Thin Straw -- Pharyngeal -- Pharyngeal- Puree Delayed swallow initiation-vallecula;Reduced epiglottic inversion;Reduced tongue base retraction;Pharyngeal residue - valleculae Pharyngeal -- Pharyngeal- Mechanical Soft Reduced epiglottic inversion;Reduced tongue base retraction;Pharyngeal residue - valleculae Pharyngeal -- Pharyngeal- Regular -- Pharyngeal -- Pharyngeal- Multi-consistency -- Pharyngeal -- Pharyngeal- Pill WFL Pharyngeal -- Pharyngeal Comment Head turn L was ineffective  CHL IP CERVICAL ESOPHAGEAL PHASE 03/27/2019 Cervical Esophageal Phase WFL Pudding Teaspoon -- Pudding Cup -- Honey Teaspoon -- Honey Cup -- Nectar Teaspoon -- Nectar Cup -- Nectar Straw -- Thin Teaspoon -- Thin Cup -- Thin Straw -- Puree -- Mechanical Soft -- Regular -- Multi-consistency -- Pill -- Cervical Esophageal Comment -- Thank you, Havery Moros, CCC-SLP [phone] PORTER,DABNEY 03/27/2019, 1:07 PM              Ct Head Code Stroke Wo Contrast  Result Date: 03/25/2019 CLINICAL DATA:  Code stroke. Focal neuro deficit. Left facial droop today EXAM: CT HEAD WITHOUT CONTRAST TECHNIQUE: Contiguous axial images were obtained from the base of the skull through the vertex without intravenous contrast. COMPARISON:  CT head 07/28/2008 FINDINGS: Brain: Negative for acute cortical infarct. Negative for acute hemorrhage or mass. Progressive atrophy.  Progressive changes throughout the white matter bilaterally which are symmetric and appear chronic. Vascular: Negative for hyperdense vessel Skull: Negative Sinuses/Orbits: Negative Other: None ASPECTS ([place] Stroke Program Early CT Score) - Ganglionic level infarction (caudate, lentiform nuclei, internal capsule, insula, M1-M3 cortex): 7 - Supraganglionic infarction (M4-M6 cortex): 3 Total score (0-10 with 10 being normal): 10 IMPRESSION: 1. No acute abnormality. 2. ASPECTS is 10 3. Progression of atrophy and white matter ischemia since 07/28/2008 4. These results were called by telephone at the time of interpretation on 03/25/2019 at 6:34 pm to provider JOSHUA LONG , who verbally acknowledged these results. Electronically Signed   By: Marlan Palau M.D.   On: 03/25/2019 18:34    Catarina Hartshorn, DO  [facility] Pager [phone]  If 7PM-7AM, please contact night-coverage [email or web address] Password [email or web address] 03/28/2019, 4:38 PM   LOS: 2 days

## 2019-03-28 NOTE — Progress Notes (Addendum)
Pt able to swallow po metoprolol in applesauce with sips of honey thick fluids without any complications.  Per Dr. Nelly Laurence note IV Cardizem discontinued after metoprolol given.

## 2019-03-28 NOTE — NC FL2 (Signed)
[place] LEVEL OF CARE SCREENING TOOL     IDENTIFICATION  Patient Name: Becky Johnson Birthdate: July 21, 1927 Sex: female Admission Date (Current Location): 03/25/2019  [facility] and [place] Number:  [employer] and Address:  [facility] [address], [place]      Provider Number: [id number]  Attending Physician Name and Address:  Orson Eva, MD  Relative Name and Phone Number:       Current Level of Care: Hospital Recommended Level of Care: [place] Prior Approval Number:    Date Approved/Denied:   PASRR Number: [id number]  Discharge Plan: SNF    Current Diagnoses: Patient Active Problem List   Diagnosis Date Noted  . Unspecified atrial fibrillation ([place]) 03/27/2019  . Acute embolic stroke ([place]) 96/29/5284  . CVA (cerebral vascular accident) ([place]) 03/26/2019  . SPINAL STENOSIS, LUMBAR 10/07/2009  . SCIATICA 09/28/2009  . Hypothyroidism 12/02/2008  . ANXIETY 12/02/2008  . HYPERTENSION, UNSPECIFIED 12/02/2008  . ABNORMAL EKG 12/02/2008    Orientation RESPIRATION BLADDER Height & Weight     Self, Time, Situation, Place  O2 External catheter Weight: 69.8 kg Height:  5\' 2"  (157.5 cm)  BEHAVIORAL SYMPTOMS/MOOD NEUROLOGICAL BOWEL NUTRITION STATUS      Continent Diet(see DC summary)  AMBULATORY STATUS COMMUNICATION OF NEEDS Skin   Extensive Assist Verbally Normal                       Personal Care Assistance Level of Assistance  Bathing, Feeding, Dressing Bathing Assistance: Limited assistance Feeding assistance: Independent Dressing Assistance: Limited assistance     Functional Limitations Info  Sight, Hearing, Speech Sight Info: Adequate Hearing Info: Adequate Speech Info: Adequate    SPECIAL CARE FACTORS FREQUENCY  PT (By licensed PT), OT (By licensed OT), Speech therapy     PT Frequency: 5x/week; OT Frequency: 3x/week     Speech Therapy Frequency: 3x/week       Contractures Contractures Info: Not present    Additional Factors Info  Code Status, Allergies Code Status Info: Full Allergies Info: NKA           Current Medications (03/28/2019):  This is the current hospital active medication list Current Facility-Administered Medications  Medication Dose Route Frequency Provider Last Rate Last Dose  .  stroke: mapping our early stages of recovery book   Does not apply Once [place], Marge Duncans, MD      . acetaminophen (TYLENOL) tablet 650 mg  650 mg Oral Q4H PRN Oswald Hillock, MD       Or  . acetaminophen (TYLENOL) solution 650 mg  650 mg Per Tube Q4H PRN Oswald Hillock, MD       Or  . acetaminophen (TYLENOL) suppository 650 mg  650 mg Rectal Q4H PRN Oswald Hillock, MD      . aspirin suppository 300 mg  300 mg Rectal Daily Oswald Hillock, MD   300 mg at 03/28/19 1324   Or  . aspirin tablet 325 mg  325 mg Oral Daily Oswald Hillock, MD      . Chlorhexidine Gluconate Cloth 2 % PADS 6 each  6 each Topical Daily Murlean Iba, MD   6 each at 03/28/19 [phone]  . enoxaparin (LOVENOX) injection 30 mg  30 mg Subcutaneous Q24H Johnson, Clanford L, MD   30 mg at 03/27/19 2011  . levothyroxine (SYNTHROID, LEVOTHROID) injection 50 mcg  50 mcg Intravenous Daily [place], Gagan S,  MD   50 mcg at 03/28/19 0937  . MEDLINE mouth rinse  15 mL Mouth Rinse BID Johnson, Clanford L, MD   15 mL at 03/28/19 0938  . metoprolol succinate (TOPROL-XL) 24 hr tablet 50 mg  50 mg Oral Daily Tat, David, MD   50 mg at 03/28/19 1022     Discharge Medications: Please see discharge summary for a list of discharge medications.  Relevant Imaging Results:  Relevant Lab Results:   Additional Information SSN [id number] [phone]  Kelli Robeck, Chrystine Oiler, RN

## 2019-03-28 NOTE — Progress Notes (Signed)
Physical Therapy Treatment Patient Details Name: Becky Johnson MRN: [id number] DOB: 08-Nov-1927 Today's Date: 03/28/2019    History of Present Illness Becky Johnson is a 85 y.o. female with past medical history reviewed below presents to the emergency department by private vehicle with acute onset slurred speech and left facial droop.  Patient states that she was last normal at 2:30 PM.  She called her nephew who ultimately saw her and transported her to the emergency department where code stroke was activated from triage.  Patient denies any numbness.  She is not having any headache, chest pain, palpitations, shortness of breath.  She denies being on blood thinners.  No radiation of symptoms or other modifying factors.    PT Comments    Patient continues to require mod assist along with frequent verbal and tactile cueing to transition to seated edge of bed. She feels dizzy upon seated which subsides quickly but after several minutes she states she doesn't feel well and requests to lay in bed. Patient returned to supine with HOB elevated and was able to perform therex. Patient educated on completing exercise while in bed throughout day. Patient continues to be most limited with activity tolerance and upright positioning. Vitals were monitored throughout session, SpO2 dropped to 87% while seated/with light activity on room air. O2 fluctuated from 93% to 87% throughout session. Patient will benefit from continued physical therapy in hospital and recommended venue below to increase strength, balance, endurance for safe ADLs and gait.   Follow Up Recommendations  SNF     Equipment Recommendations  Rolling walker with 5" wheels    Recommendations for Other Services       Precautions / Restrictions Precautions Precautions: Fall Restrictions Weight Bearing Restrictions: No    Mobility  Bed Mobility Overal bed mobility: Needs Assistance Bed Mobility: Supine to Sit      Supine to sit: Mod assist     General bed mobility comments: requires mod assist to transition to seated at bedside, patient states she does not feel well after several minutes and requests to return to supine, vitals monitored and SpO2 moved between 87-90% while seated on room air  Transfers                    Ambulation/Gait                 Stairs             Wheelchair Mobility    Modified Rankin (Stroke Patients Only)       Balance Overall balance assessment: Needs assistance Sitting-balance support: Bilateral upper extremity supported Sitting balance-Leahy Scale: Fair Sitting balance - Comments: at bedside, feet could not reach floor                                    Cognition Arousal/Alertness: Awake/alert Behavior During Therapy: WFL for tasks assessed/performed;Flat affect Overall Cognitive Status: Within Functional Limits for tasks assessed                                        Exercises General Exercises - Lower Extremity Ankle Circles/Pumps: AROM;Both;15 reps;Supine Quad Sets: AROM;Supine;Both;10 reps Heel Slides: AROM;Supine;Both;10 reps Hip Flexion/Marching: AROM;Both;Supine;10 reps    General Comments        Pertinent Vitals/Pain Pain Assessment: No/denies pain  Home Living     Available Help at Discharge: Family;Friend(s);Available PRN/intermittently Type of Home: Apartment              Prior Function            PT Goals (current goals can now be found in the care plan section) Acute Rehab PT Goals Patient Stated Goal: Return home after rehab PT Goal Formulation: With patient Time For Goal Achievement: 04/09/19 Potential to Achieve Goals: Fair Progress towards PT goals: Progressing toward goals    Frequency    7X/week      PT Plan Current plan remains appropriate    Co-evaluation              AM-PAC PT "6 Clicks" Mobility   Outcome Measure  Help needed  turning from your back to your side while in a flat bed without using bedrails?: A Lot Help needed moving from lying on your back to sitting on the side of a flat bed without using bedrails?: A Lot Help needed moving to and from a bed to a chair (including a wheelchair)?: A Lot Help needed standing up from a chair using your arms (e.g., wheelchair or bedside chair)?: A Lot Help needed to walk in hospital room?: A Lot Help needed climbing 3-5 steps with a railing? : A Lot 6 Click Score: 12    End of Session   Activity Tolerance: Patient tolerated treatment well;Patient limited by fatigue Patient left: in bed;with call bell/phone within reach;with bed alarm set Nurse Communication: Mobility status PT Visit Diagnosis: Unsteadiness on feet (R26.81);Other abnormalities of gait and mobility (R26.89);Muscle weakness (generalized) (M62.81);Other symptoms and signs involving the nervous system (R29.898)     Time: 8546-2703 PT Time Calculation (min) (ACUTE ONLY): 24 min  Charges:  $Therapeutic Exercise: 8-22 mins $Therapeutic Activity: 8-22 mins                     2:46 PM, 03/28/19 Mearl Latin PT, DPT Physical Therapist at [facility]

## 2019-03-28 NOTE — Progress Notes (Signed)
  Speech Language Pathology Treatment: Dysphagia  Patient Details Name: Becky Johnson MRN: [id number] DOB: [birth year] Today's Date: 03/28/2019 Time: 6237-6283 SLP Time Calculation (min) (ACUTE ONLY): 24 min  Assessment / Plan / Recommendation Clinical Impression  Pt seen for dysphagia intervention following MBSS completed yesterday. Pt was placed on D1/puree and HTL and po medications whole in puree. Pt reportedly tolerating well. SLP worked with Pt during consumption of lunch meal of diet above. Pt able to self feed after set up assist. Pt with prolonged oral phase, suspected delay in swallow initiation, and occasional cough/throat clear after sips of HTL. Pt was reminded to take small sips, swallow 2x, avoid talking during meals, and to cough strongly if she felt the sensation to cough. Pt with improved vocal quality and stronger cough today. Continue diet as ordered and recommend SNF post discharge. Pt would benefit from [employer] on her tray, encouraged liquid intake, and consider RD consult due to modified textures and consistencies. SLP will follow.    HPI HPI: Becky Johnson  is a 85 y.o. female, with history of hypertension, hypothyroidism was brought to the ED by private vehicle with acute onset slurred speech and left facial droop.  Patient says that she was last normal at 2:30 PM she called her nephew who ultimately saw her and transported her to ER, code stroke was activated from triage. In the ED tele neurology was consulted, patient was not found to be a TPA candidate as she was out of window for TPA.  Patient was found to be in A. fib with RVR, no anticoagulation recommended due to stroke.  CT head was negative for acute abnormality. MRI shows: Moderate to large acute infarct in the right lateral frontal lobe. Pt failed [employer] screen. BSE and SLE ordered      SLP Plan  Continue with current plan of care       Recommendations  Diet recommendations: Dysphagia 1  (puree);Honey-thick liquid Liquids provided via: Cup Medication Administration: Crushed with puree Supervision: Patient able to self feed;Full supervision/cueing for compensatory strategies Compensations: Slow rate;Small sips/bites;Multiple dry swallows after each bite/sip;Clear throat intermittently Postural Changes and/or Swallow Maneuvers: Seated upright 90 degrees;Upright 30-60 min after meal                Oral Care Recommendations: Oral care BID;Staff/trained caregiver to provide oral care Follow up Recommendations: Skilled Nursing facility SLP Visit Diagnosis: Dysphagia, oropharyngeal phase (R13.12) Plan: Continue with current plan of care       Thank you,  Becky Johnson, [place]                 [place] 03/28/2019, 1:43 PM

## 2019-03-28 NOTE — Care Management (Addendum)
SNF Quality measures  Map marker for [facility]  [facility]  [address]  [place], [place] [address] [phone]    Add [facility]  to My Favorites- Opens in a new window    5 out of 5 starsfootnote   Much Above Average 5 out of 5 starsfootnote   Much Above Average 3 out of 5 starsfootnote   Average 3 out of 5 starsfootnote   Average 0.3  Miles    Map marker for [facility] [place]  [facility] [place]  [address]  [place], [place] [address] [phone]        Add [facility] [place]  to My Favorites- Opens in a new window    2 out of 5 starsfootnote   Below Average 2 out of 5 starsfootnote   Below Average 2 out of 5 starsfootnote   Below Average 2 out of 5 starsfootnote   Below Average 0.4  Miles    Map marker for [facility]  [facility]  [address]  [place], [place] [address] [phone]        Add [facility]  to My Favorites- Opens in a new window    4 out of 5 starsfootnote   Above Average 4 out of 5 starsfootnote   Above Average 2 out of 5 starsfootnote   Below Average 4 out of 5 starsfootnote   Above Average 13.6  Miles    Map marker for [facility]  [facility]  [address]  [place], [place] [address] [phone]        Add [facility]  to My Favorites- Opens in a new window    3 out of 5 starsfootnote   Average 3 out of 5 starsfootnote   Average 2 out of 5 starsfootnote   Below Average 3 out of 5 starsfootnote   Average 15.4  Miles    Map marker for [facility]  [facility]  [address]  [place], [place] [address] [phone]        Add [facility]  to My Favorites- Opens in a new window    1  out of 5 starsfootnote   Much Below Average 2 out of 5 starsfootnote   Below Average 1 out of 5 starsfootnote   Much Below Average 3 out of 5 starsfootnote   Average 16.7  Miles    Map marker for [facility]  [facility]  [address]  [place], [place] [address] [phone]        Add [facility]  to My Favorites- Opens in a new window    3 out of 5 starsfootnote   Average 3 out of 5 starsfootnote   Average 2 out of 5 starsfootnote   Below Average 2 out of 5 starsfootnote   Below Average 22.6  Miles    Map marker for [facility]  [facility]  [address]  [place],  [address] [phone]        Add [facility]  to My Favorites- Opens in a new window    1 out of 5 starsfootnote     Much Below Average 1 out of 5 starsfootnote   Much Below Average 2 out of 5 starsfootnote   Below Average 2 out of 5 starsfootnote   Below Average 23.2  Miles    Map marker for [facility]  [address]  [place], [place] [address] [phone]        Add [facility]  to My Favorites- Opens in a new window    4 out of 5 starsfootnote   Above Average 3 out of 5 starsfootnote   Average 2 out of 5 starsfootnote   Below Average 5 out of 5 starsfootnote   Much Above Average 23.3  Miles    Map marker for [facility]  [facility]  [address]  [place], [place] [address] [phone]        Add [facility]  to My Favorites- Opens in a new window    1 out of 5 starsfootnote   Much Below Average 1 out of 5 starsfootnote   Much Below Average 2 out of 5 starsfootnote   Below Average 3 out of 5 starsfootnote   Average 23.3  Miles    Map marker for [facility]  [place]  [facility] [place]  [address]  [place],  [address] [phone]        Add [facility] [place]  to My Favorites- Opens in a new window    5 out of 5 starsfootnote   Much Above Average 5 out of 5 starsfootnote   Much Above Average 3 out of 5 starsfootnote   Average 2 out of 5 starsfootnote   Below Average 24.5  Miles    Map marker for [facility]  [facility]  [address]  [place],  [address] [phone]        Add [facility]  to My Favorites- Opens in a new window    2 out of 5 starsfootnote   Below Average 2 out of 5 starsfootnote   Below Average 2 out of 5 starsfootnote   Below Average 3 out of 5 starsfootnote   Average 24.6     

## 2019-03-28 NOTE — TOC Progression Note (Signed)
Transition of Care [facility]) - Progression Note    Patient Details  Name: Becky Johnson MRN: [id number] Date of Birth: 21-Jan-1928  [facility] CM/SW Contact  Keily Lepp, Chauncey Reading, RN Phone Number: 03/28/2019, 2:28 PM  Clinical Narrative:   [facility] can not make bed offer. Discussed with patient and grand daughter Becky Johnson other bed offers. Discussed Medicare star rating of all facilities. Patient elects [facility]. CM has started authorization.   Discussed with patient and grand daughter, that SNF cost will be covered for 20 days and copay of $178 will begin on day 21.  Patient may need to apply for Medicaid if needs more than 20 days.  Patient plans to return home after SNF, has friends/family that live next door.   Patient will need a negative covid test within 48 hours of DC to facility.     Expected Discharge Plan: [place] Barriers to Discharge: Continued Medical Work up, SNF Pending bed offer  Expected Discharge Plan and Services Expected Discharge Plan: [place]   Discharge Planning Services: CM Consult   Living arrangements for the past 2 months: Single Family Home                            Social Determinants of Health (SDOH) Interventions    Readmission Risk Interventions No flowsheet data found.

## 2019-03-28 NOTE — Evaluation (Signed)
Speech Language Pathology Evaluation Patient Details Name: Becky Johnson MRN: [id number] DOB: 04/21/1928 Today's Date: 03/28/2019 Time: 1254-1310 SLP Time Calculation (min) (ACUTE ONLY): 16 min  Problem List:  Patient Active Problem List   Diagnosis Date Noted  . Unspecified atrial fibrillation (HCC) 03/27/2019  . Acute embolic stroke (HCC) 03/27/2019  . CVA (cerebral vascular accident) (HCC) 03/26/2019  . SPINAL STENOSIS, LUMBAR 10/07/2009  . SCIATICA 09/28/2009  . Hypothyroidism 12/02/2008  . ANXIETY 12/02/2008  . HYPERTENSION, UNSPECIFIED 12/02/2008  . ABNORMAL EKG 12/02/2008   Past Medical History:  Past Medical History:  Diagnosis Date  . Essential hypertension   . Hypothyroidism    Past Surgical History: History reviewed. No pertinent surgical history. HPI:  Becky Johnson  is a 85 y.o. female, with history of hypertension, hypothyroidism was brought to the ED by private vehicle with acute onset slurred speech and left facial droop.  Patient says that she was last normal at 2:30 PM she called her nephew who ultimately saw her and transported her to ER, code stroke was activated from triage. In the ED tele neurology was consulted, patient was not found to be a TPA candidate as she was out of window for TPA.  Patient was found to be in A. fib with RVR, no anticoagulation recommended due to stroke.  CT head was negative for acute abnormality. MRI shows: Moderate to large acute infarct in the right lateral frontal lobe. Pt failed [employer] screen. BSE and SLE ordered   Assessment / Plan / Recommendation Clinical Impression  Pt presents with mild cognitive linguistic deficits characterized by mild dysarthria impacting speech intelligibility at the conversation level and working memory deficits. Pt with improving awareness of deficits. Pt reports that she lives alone and manages independently. [place] Pt also presents with  moderate oropharyngeal dysphagia with diet modified per MBSS yesterday to D1 and HTL. Recommend ongoing SLP services in SNF. SLP will follow during acute stay.     SLP Assessment  SLP Recommendation/Assessment: Patient needs continued Speech Lanaguage Pathology Services SLP Visit Diagnosis: Dysarthria and anarthria (R47.1)    Follow Up Recommendations  Skilled Nursing facility    Frequency and Duration min 2x/week  1 week      SLP Evaluation Cognition  Overall Cognitive Status: No family/caregiver present to determine baseline cognitive functioning Arousal/Alertness: Awake/alert Orientation Level: Oriented X4 Memory: Impaired Memory Impairment: Decreased short term memory Decreased Short Term Memory: Verbal complex Awareness: Appears intact Problem Solving: Appears intact Safety/Judgment: Appears intact       Comprehension  Auditory Comprehension Overall Auditory Comprehension: Appears within functional limits for tasks assessed Yes/No Questions: Within Functional Limits Commands: Within Functional Limits Conversation: Simple Interfering Components: Working Radio broadcast assistant: Repetition;Stressing Conservation officer, historic buildings Discrimination: Within Function Limits Reading Comprehension Reading Status: Not tested    Expression Expression Primary Mode of Expression: Verbal Verbal Expression Overall Verbal Expression: Appears within functional limits for tasks assessed Initiation: No impairment Automatic Speech: Name;Social Response;Counting Level of Generative/Spontaneous Verbalization: Conversation Repetition: No impairment Naming: No impairment Pragmatics: No impairment Interfering Components: Speech intelligibility Non-Verbal Means of Communication: Not applicable Written Expression Dominant Hand: Right Written Expression: Not tested   Oral / Motor  Oral Motor/Sensory Function Overall Oral Motor/Sensory Function: Mild impairment Facial ROM:  Reduced left;Suspected CN VII (facial) dysfunction Facial Symmetry: Abnormal symmetry left;Suspected CN VII (facial) dysfunction Facial Strength: Suspected CN VII (facial) dysfunction;Reduced left Facial Sensation: Within Functional Limits Lingual ROM: Within Functional Limits Lingual Symmetry:  Within Functional Limits Lingual Strength: Reduced Lingual Sensation: Reduced;Suspected CN VII (facial) dysfunction-anterior 2/3 tongue Velum: Within Functional Limits Mandible: Within Functional Limits Motor Speech Overall Motor Speech: Impaired Respiration: Impaired Level of Impairment: Sentence Phonation: Normal;Low vocal intensity Resonance: Within functional limits Articulation: Impaired Level of Impairment: Conversation Intelligibility: Intelligibility reduced Word: 75-100% accurate Phrase: 75-100% accurate Sentence: 75-100% accurate Conversation: 75-100% accurate Motor Planning: Witnin functional limits Motor Speech Errors: Aware Effective Techniques: Slow rate;Over-articulate   Thank you,  Genene Churn, [place]                     Elsie 03/28/2019, 2:02 PM

## 2019-03-28 NOTE — Progress Notes (Signed)
Tele shows she is in SR this AM. Would restart her home toprol 50mg  daily when able to from a swallowing standpoint, and d/c dilt gtt at that time. . Start eliquis 5mg  bid when ok with neuro. No further cardiology recs at this time, we will sign off inpatient care.   Zandra Abts MD

## 2019-03-29 ENCOUNTER — Other Ambulatory Visit: Payer: Self-pay

## 2019-03-29 DIAGNOSIS — J9601 Acute respiratory failure with hypoxia: Secondary | ICD-10-CM

## 2019-03-29 LAB — CBC
HCT: 38.3 % (ref 36.0–46.0)
Hemoglobin: 11.5 g/dL — ABNORMAL LOW (ref 12.0–15.0)
MCH: 27 pg (ref 26.0–34.0)
MCHC: 30 g/dL (ref 30.0–36.0)
MCV: 89.9 fL (ref 80.0–100.0)
Platelets: 274 10*3/uL (ref 150–400)
RBC: 4.26 MIL/uL (ref 3.87–5.11)
RDW: 16.7 % — ABNORMAL HIGH (ref 11.5–15.5)
WBC: 9.6 10*3/uL (ref 4.0–10.5)
nRBC: 0 % (ref 0.0–0.2)

## 2019-03-29 LAB — BASIC METABOLIC PANEL
Anion gap: 9 (ref 5–15)
BUN: 27 mg/dL — ABNORMAL HIGH (ref 8–23)
CO2: 23 mmol/L (ref 22–32)
Calcium: 8.4 mg/dL — ABNORMAL LOW (ref 8.9–10.3)
Chloride: 106 mmol/L (ref 98–111)
Creatinine, Ser: 1.22 mg/dL — ABNORMAL HIGH (ref 0.44–1.00)
GFR calc Af Amer: 46 mL/min — ABNORMAL LOW (ref >60)
GFR calc non Af Amer: 40 mL/min — ABNORMAL LOW (ref >60)
Glucose, Bld: 127 mg/dL — ABNORMAL HIGH (ref 70–99)
Potassium: 3.9 mmol/L (ref 3.5–5.1)
Sodium: 138 mmol/L (ref 135–145)

## 2019-03-29 LAB — TROPONIN I (HIGH SENSITIVITY)
Troponin I (High Sensitivity): 10 ng/L (ref <18)
Troponin I (High Sensitivity): 10 ng/L (ref <18)

## 2019-03-29 LAB — SARS CORONAVIRUS 2 (TAT 6-24 HRS): SARS Coronavirus 2: NEGATIVE

## 2019-03-29 MED ORDER — PROCHLORPERAZINE EDISYLATE 10 MG/2ML IJ SOLN
5.0000 mg | INTRAMUSCULAR | Status: DC | PRN
  Administered 2019-03-29: 5 mg via INTRAVENOUS
  Filled 2019-03-29: qty 2

## 2019-03-29 MED ORDER — LEVOTHYROXINE SODIUM 100 MCG PO TABS: 100.0000 ug | ORAL_TABLET | Freq: Every day | ORAL | Status: DC

## 2019-03-29 MED ORDER — METOPROLOL SUCCINATE ER 50 MG PO TB24: 75.0000 mg | ORAL_TABLET | Freq: Every day | ORAL | Status: DC

## 2019-03-29 MED ORDER — APIXABAN 5 MG PO TABS: 5.0000 mg | ORAL_TABLET | Freq: Two times a day (BID) | ORAL | 1 refills | Status: AC

## 2019-03-29 MED ORDER — METOPROLOL SUCCINATE ER 25 MG PO TB24
25.0000 mg | ORAL_TABLET | Freq: Once | ORAL | Status: AC
  Administered 2019-03-29: 25 mg via ORAL
  Filled 2019-03-29: qty 1

## 2019-03-29 MED ORDER — ENOXAPARIN SODIUM 30 MG/0.3ML ~~LOC~~ SOLN
30.0000 mg | SUBCUTANEOUS | Status: DC
  Administered 2019-03-29: 30 mg via SUBCUTANEOUS
  Filled 2019-03-29: qty 0.3

## 2019-03-29 MED ORDER — MORPHINE SULFATE (PF) 4 MG/ML IV SOLN
4.0000 mg | INTRAVENOUS | Status: DC | PRN
  Administered 2019-03-29: 4 mg via INTRAVENOUS
  Filled 2019-03-29: qty 1

## 2019-03-29 MED ORDER — APIXABAN 5 MG PO TABS: 5.0000 mg | ORAL_TABLET | Freq: Two times a day (BID) | ORAL | 1 refills | Status: DC

## 2019-03-29 MED ORDER — APIXABAN 5 MG PO TABS: 5.0000 mg | ORAL_TABLET | Freq: Two times a day (BID) | ORAL | Status: DC

## 2019-03-29 MED ORDER — FUROSEMIDE 20 MG PO TABS: 20.0000 mg | ORAL_TABLET | Freq: Every day | ORAL | 1 refills | Status: AC

## 2019-03-29 MED ORDER — ASPIRIN 325 MG PO TABS: 325.0000 mg | ORAL_TABLET | Freq: Every day | ORAL | 0 refills | Status: DC

## 2019-03-29 MED ORDER — METOPROLOL SUCCINATE ER 25 MG PO TB24: 75.0000 mg | ORAL_TABLET | Freq: Every day | ORAL | 1 refills | Status: AC

## 2019-03-29 NOTE — Progress Notes (Signed)
PT Cancellation Note  Patient Details Name: Becky Johnson MRN: [id number] DOB: 1928/04/16   Cancelled Treatment:    Reason Eval/Treat Not Completed: Fatigue/lethargy limiting ability to participate pt declined PT stating she was too tired to participate this morning. Will check back later  10:04 AM, 03/29/19 Mearl Latin PT, DPT Physical Therapist at [facility]

## 2019-03-29 NOTE — Progress Notes (Signed)
TRH floor coverage note.  The patient had an episode of right sided CP. Her EKG was non-ischemic. Troponin has been within normal limits so far, pending second measurement. She was given morphine 4 mg IVP and prochlorperazine 5 mg IVP. The pain has subsided. She is resting and in NAD. Will continue cardiac monitoring.  Tennis Must, MD

## 2019-03-29 NOTE — TOC Transition Note (Signed)
Transition of Care [facility]) - CM/SW Discharge Note   Patient Details  Name: Becky Johnson MRN: [id number] Date of Birth: Nov 04, 1927  [facility] CM/SW Contact:  Shade Flood, LCSW Phone Number: 03/29/2019, 1:40 PM   Clinical Narrative:     Pt stable for dc to [facility] today per MD. Received Dillon Bjork and pt's covid test results are back. Updated Mardene Celeste at [facility]. They can accept pt today. They ask that pt not transport until 3:00. DC clinical sent electronically. Updated pt's gdtr and pt's RN. RN will arrange transport.   There are no other TOC needs for dc.    Final next level of care: Skilled Nursing Facility Barriers to Discharge: Barriers Resolved   Patient Goals and CMS Choice Patient states their goals for this hospitalization and ongoing recovery are:: patient is agreeable to SNF but ultimatley would like to go home CMS Medicare.gov Compare Post Acute Care list provided to:: Patient Choice offered to / list presented to : Patient  Discharge Placement PASRR number recieved: 03/28/19            Patient chooses bed at: Other - please specify in the comment section below:([facility]) Patient to be transferred to facility by: EMS Name of family member notified: Janett Billow Patient and family notified of of transfer: 03/29/19  Discharge Plan and Services   Discharge Planning Services: CM Consult                                 Social Determinants of Health (SDOH) Interventions     Readmission Risk Interventions Readmission Risk Prevention Plan 03/29/2019  Transportation Screening Complete  PCP or Specialist Appt within 5-7 Days Not Complete  Not Complete comments SNF MD will follow  Home Care Screening Not Complete  Home Care Screening Not Completed Comments Pt going to SNF rehab  Medication Review (RN CM) Complete  Some recent data might be hidden

## 2019-03-29 NOTE — Progress Notes (Signed)
  Speech Language Pathology Treatment: Dysphagia  Patient Details Name: MICAH GALENO MRN: [id number] DOB: 01/20/1928 Today's Date: 03/29/2019 Time: 9147-8295 SLP Time Calculation (min) (ACUTE ONLY): 42 min  Assessment / Plan / Recommendation Clinical Impression  Pt was easily roused from sleeping and participated in ongoing diagnostic dysphagia therapy while seated upright in bed. SLP provided oral care and note white and brown coating on lingual surface that was not cleared despite oral scrubbing and rinsing. Coating reported to RN and MD. Following oral care Pt consumed ice chips and cup sips of thin liquids with immediate coughing and overt s/sx of decreased airway protection, Pt then consumed trials of NTL with wet vocal quality after all trials and a delayed weak non-productive cough. SLP provided a few trials of regular and mechanical soft and note prolonged and uncoordinated oral prep stage with mild to moderate oral residue after the swallow requiring HTL wash to clear residue. Pt reports she does not have the "energy" to chew; it is evident she is not yet strong enough or ready to recommend upgrade at this time. Recommend continue with HTL and D1/puree diet RN reports Pt has been tolerating meds whole in puree. Recommend oral care BID with mouth wash. Recommend continued ST services at SNF with repeat MBS when it is clinically appropriate.    HPI HPI: Sitlali Koerner  is a 85 y.o. female, with history of hypertension, hypothyroidism was brought to the ED by private vehicle with acute onset slurred speech and left facial droop.  Patient says that she was last normal at 2:30 PM she called her nephew who ultimately saw her and transported her to ER, code stroke was activated from triage. In the ED tele neurology was consulted, patient was not found to be a TPA candidate as she was out of window for TPA.  Patient was found to be in A. fib with RVR, no anticoagulation recommended due to  stroke.  CT head was negative for acute abnormality. MRI shows: Moderate to large acute infarct in the right lateral frontal lobe. Pt failed [employer] screen. BSE and SLE ordered      SLP Plan  Continue with current plan of care       Recommendations  Diet recommendations: Dysphagia 1 (puree);Honey-thick liquid Liquids provided via: Cup Medication Administration: Crushed with puree Supervision: Patient able to self feed;Full supervision/cueing for compensatory strategies Compensations: Slow rate;Small sips/bites;Multiple dry swallows after each bite/sip;Clear throat intermittently Postural Changes and/or Swallow Maneuvers: Seated upright 90 degrees;Upright 30-60 min after meal                Oral Care Recommendations: Oral care BID;Staff/trained caregiver to provide oral care Follow up Recommendations: Skilled Nursing facility SLP Visit Diagnosis: Dysphagia, oropharyngeal phase (R13.12) Plan: Continue with current plan of care       Amelia H. Roddie Mc, CCC-SLP Speech Language Pathologist       Wende Bushy 03/29/2019, 12:08 PM

## 2019-03-29 NOTE — Progress Notes (Signed)
Pt c/o CP 7/10- Dr. Olevia Bowens paged and made aware. New order for EKG, morphine and compazine. EKG showed NR with 1st AV block. MD made aware. Will continue to monitor pt

## 2019-03-29 NOTE — Care Management Important Message (Signed)
Important Message  Patient Details  Name: Becky Johnson MRN: [id number] Date of Birth: Mar 10, 1928   Medicare Important Message Given:  Yes     Tommy Medal 03/29/2019, 12:16 PM

## 2019-03-29 NOTE — Discharge Summary (Signed)
Physician Discharge Summary  Becky Johnson ZOX:[id number] DOB: December 03, 1927 DOA: 03/25/2019  PCP: Karleen Hampshire, MD  Admit date: 03/25/2019 Discharge date: 03/29/2019  Admitted From: Home Disposition:  Home   Recommendations for Outpatient Follow-up:  1. Follow up with PCP in 1-2 weeks 2. Please obtain BMP/CBC in one week 3. Please have speech therapy to re-evaluate her swallowing/dysphagia for safest diet 4. Wean oxygen as tolerated for saturation >92% 5. Stop ASA after dose on 04/01/19 and start apixaban on 04/02/19     Discharge Condition: Stable CODE STATUS: FULL Diet recommendation: Dysphagia I with honey thickened liquids   Brief/Interim Summary: 85 year old female with a history of hypertension, hypothyroidism, anxiety presenting with dysarthria and left facial droop.The patient was also noted to have a right gaze preference in the emergency department. She was not felt to be a TPA candidate given the difficulty of identifying last known normal time. Teleneurologywas consulted and recommended admission for further evaluation. In addition, the patient was noted to have new onset atrial fibrillation with RVR. She was started on diltiazem drip. Cardiology was consulted to assist with management. Neurology was also consulted. MRI of the brain showed a moderate to large acute infarct in the right frontal lobe. Neurology recommended continuing aspirin for 1 week before starting anticoagulation.  Discharge Diagnoses:  Acute right frontal stroke -Appreciate Neurology Consult -PT/OT evaluation-->SNF -Speech therapy eval--dysphagia 1 diet with honey thickened liquids -CT brain--neg -CTA H&N--right ICA 50%, left ICA 70% stenosis. Abrupt ROM for branch occlusion, likely embolic. Large bilateral pleural effusions. -MRI brain--moderate to large acute infarct of right frontal lobe -Carotid Duplex--no hemodynamically significant stenosis -Echo--EF 60 to 65%,  indeterminate diastolic function, no PFO. Mild MR, trivial TRatri -LDL--69 -HbA1C--6.3 -Antiplatelet--aspirin 325 mg x 3 more days after discharge, then stop  Acute diastolic CHF -Received IV furosemide x 2 doses -start po lasix 20 mg daily -neg 1.8 L with initial dose IV lasix -daily weights  Acute Respiratory failure with hypoxia -initially on 4L -due to pleural effusion -now down to 1L  Hypothyroidism -Continue IV Synthroid until able to toleratepo -TSH 9.143; Free T4--1.05 -Continue current dose of Synthroid -Repeat TSH in 4 weeks  Dysphagia -Speech therapy consult appreciated -dysphagia I with honey thickened liquids  Atrial fibrillation, new onset, type unspecified - diltiazem drip>>>transitioned back to po metoprolol -Continue aspirin for 1 week before switching to anticoagulation per neurology recommendations -start apixaban on 04/01/19 per neurology recommendation  Hypokalemia -Replete -Magnesium 1.9  Impaired glucose tolerance -Hemoglobin A1c 6.3      Discharge Instructions   Allergies as of 03/29/2019   No Known Allergies     Medication List    TAKE these medications   apixaban 5 MG Tabs tablet Commonly known as: ELIQUIS Take 1 tablet (5 mg total) by mouth 2 (two) times daily. Start 04/02/19 Start taking on: April 02, 2019   Arthritis Pain 650 MG CR tablet Generic drug: acetaminophen Take 650 mg by mouth every 8 (eight) hours as needed for pain.   aspirin 325 MG tablet Take 1 tablet (325 mg total) by mouth daily. X 3 days only Start taking on: March 30, 2019   furosemide 20 MG tablet Commonly known as: LASIX Take 1 tablet (20 mg total) by mouth daily. Start taking on: March 30, 2019   levothyroxine 100 MCG tablet Commonly known as: SYNTHROID Take 100 mcg by mouth every morning.   metoprolol succinate 25 MG 24 hr tablet Commonly known as: TOPROL-XL Take 3 tablets (75 mg total) by  mouth daily. Start taking on:  March 30, 2019 What changed:   medication strength  how much to take   pantoprazole 40 MG tablet Commonly known as: PROTONIX Take 40 mg by mouth daily.   simvastatin 20 MG tablet Commonly known as: ZOCOR Take 20 mg by mouth every morning.   Theratears 0.25 % Soln Generic drug: Carboxymethylcellulose Sodium Apply 1-2 drops to eye daily as needed (for irritation/dry eye).       Contact information for follow-up providers    Ellsworth Lennox, PA-C Follow up on 04/12/2019.   Specialties: Physician Assistant, Cardiology Why: [facility] Follow-up on 04/12/2019 at 2:30 PM.  Contact information: [address] [place] [address] [phone]            Contact information for after-discharge care    Destination    [facility] Preferred SNF .   Service: Skilled Nursing Contact information: [address] [place] [address] [phone]                 No Known Allergies  Consultations:  Neurology, cardiology   Procedures/Studies: Ct Angio Head W Or Wo Contrast  Result Date: 03/26/2019 CLINICAL DATA:  Initial evaluation for acute slurred speech, left-sided facial droop. EXAM: CT ANGIOGRAPHY HEAD AND NECK TECHNIQUE: Multidetector CT imaging of the head and neck was performed using the standard protocol during bolus administration of intravenous contrast. Multiplanar CT image reconstructions and MIPs were obtained to evaluate the vascular anatomy. Carotid stenosis measurements (when applicable) are obtained utilizing NASCET criteria, using the distal internal carotid diameter as the denominator. CONTRAST:  75mL OMNIPAQUE IOHEXOL 350 MG/ML SOLN COMPARISON:  Prior head CT from 03/25/2019. FINDINGS: CTA NECK FINDINGS Aortic arch: Visualized aortic arch of normal caliber. Aberrant right subclavian artery noted. No hemodynamically significant stenosis seen about the origin of the great vessels. Right  carotid system: Right CCA widely patent from its origin to the bifurcation without stenosis. Bulky plaque about the right bifurcation/proximal right ICA with associated stenosis of up to 50% by NASCET criteria. Right ICA widely patent distally to the skull base without stenosis, dissection, or occlusion. Left carotid system: Left CCA widely patent from its origin to the bifurcation. Bulky plaque about the left bifurcation/proximal left ICA with associated stenosis of up to 70% by NASCET criteria. Left ICA widely patent distally to the skull base without stenosis, dissection, or occlusion. Vertebral arteries: Both vertebral arteries arise from the subclavian arteries. Vertebral arteries widely patent within the neck without stenosis dissection or occlusion. Skeleton: No acute osseous finding. No discrete lytic or blastic osseous lesions. Mild cervical spondylosis at C5-6 and C6-7. Other neck: No other acute soft tissue abnormality within the neck. Thyroid is absent. No mass lesion or adenopathy. Upper chest: Enlarged prevascular node measures up to 1.7 cm, indeterminate. Large layering bilateral pleural effusions partially visualized. Associated dependent atelectasis. Hazy ground-glass opacity within the visualized lungs likely reflects a degree of pulmonary edema. Review of the MIP images confirms the above findings CTA HEAD FINDINGS Anterior circulation: Petrous segments widely patent bilaterally. Scattered atheromatous plaque within the cavernous/supraclinoid ICAs with associated mild multifocal narrowing. No hemodynamically significant stenosis. ICA termini well perfused. A1 segments widely patent. Normal anterior communicating artery complex. Anterior cerebral arteries widely patent to their distal aspects without stenosis. M1 segments widely patent bilaterally. Normal MCA bifurcations. On the right, there is abrupt occlusion of a distal right M4 branch (series 7, image 73), likely embolic in nature.  Distal MCA  branches otherwise well perfused bilaterally and fairly symmetric. Posterior circulation: Vertebral arteries widely patent to the vertebrobasilar junction without stenosis. Posterior inferior cerebral arteries patent bilaterally. Basilar widely patent to its distal aspect without stenosis. Superior cerebral arteries patent bilaterally. Both of the posterior cerebral arteries primarily supplied via the basilar. Scattered atheromatous change with associated mild to moderate multifocal stenoses noted within the P2 segments bilaterally. PCAs are perfused to their distal aspects. Venous sinuses: Patent. Anatomic variants: None significant. Review of the MIP images confirms the above findings IMPRESSION: 1. Abrupt occlusion of a distal right M4 branch, likely embolic. 2. Atheromatous plaque about the carotid bifurcations bilaterally, with associated stenoses of up to 50% on the right and 70% on the left. 3. Aberrant right subclavian artery. 4. Large layering bilateral pleural effusions with probable pulmonary interstitial edema, partially visualized. 5. Enlarged 1.7 cm mediastinal prevascular node, indeterminate, but could be reactive. Electronically Signed   By: Rise Mu M.D.   On: 03/26/2019 03:32   Ct Angio Neck W Or Wo Contrast  Result Date: 03/26/2019 CLINICAL DATA:  Initial evaluation for acute slurred speech, left-sided facial droop. EXAM: CT ANGIOGRAPHY HEAD AND NECK TECHNIQUE: Multidetector CT imaging of the head and neck was performed using the standard protocol during bolus administration of intravenous contrast. Multiplanar CT image reconstructions and MIPs were obtained to evaluate the vascular anatomy. Carotid stenosis measurements (when applicable) are obtained utilizing NASCET criteria, using the distal internal carotid diameter as the denominator. CONTRAST:  75mL OMNIPAQUE IOHEXOL 350 MG/ML SOLN COMPARISON:  Prior head CT from 03/25/2019. FINDINGS: CTA NECK FINDINGS Aortic arch:  Visualized aortic arch of normal caliber. Aberrant right subclavian artery noted. No hemodynamically significant stenosis seen about the origin of the great vessels. Right carotid system: Right CCA widely patent from its origin to the bifurcation without stenosis. Bulky plaque about the right bifurcation/proximal right ICA with associated stenosis of up to 50% by NASCET criteria. Right ICA widely patent distally to the skull base without stenosis, dissection, or occlusion. Left carotid system: Left CCA widely patent from its origin to the bifurcation. Bulky plaque about the left bifurcation/proximal left ICA with associated stenosis of up to 70% by NASCET criteria. Left ICA widely patent distally to the skull base without stenosis, dissection, or occlusion. Vertebral arteries: Both vertebral arteries arise from the subclavian arteries. Vertebral arteries widely patent within the neck without stenosis dissection or occlusion. Skeleton: No acute osseous finding. No discrete lytic or blastic osseous lesions. Mild cervical spondylosis at C5-6 and C6-7. Other neck: No other acute soft tissue abnormality within the neck. Thyroid is absent. No mass lesion or adenopathy. Upper chest: Enlarged prevascular node measures up to 1.7 cm, indeterminate. Large layering bilateral pleural effusions partially visualized. Associated dependent atelectasis. Hazy ground-glass opacity within the visualized lungs likely reflects a degree of pulmonary edema. Review of the MIP images confirms the above findings CTA HEAD FINDINGS Anterior circulation: Petrous segments widely patent bilaterally. Scattered atheromatous plaque within the cavernous/supraclinoid ICAs with associated mild multifocal narrowing. No hemodynamically significant stenosis. ICA termini well perfused. A1 segments widely patent. Normal anterior communicating artery complex. Anterior cerebral arteries widely patent to their distal aspects without stenosis. M1 segments widely  patent bilaterally. Normal MCA bifurcations. On the right, there is abrupt occlusion of a distal right M4 branch (series 7, image 73), likely embolic in nature. Distal MCA branches otherwise well perfused bilaterally and fairly symmetric. Posterior circulation: Vertebral arteries widely patent to the vertebrobasilar junction without stenosis. Posterior  inferior cerebral arteries patent bilaterally. Basilar widely patent to its distal aspect without stenosis. Superior cerebral arteries patent bilaterally. Both of the posterior cerebral arteries primarily supplied via the basilar. Scattered atheromatous change with associated mild to moderate multifocal stenoses noted within the P2 segments bilaterally. PCAs are perfused to their distal aspects. Venous sinuses: Patent. Anatomic variants: None significant. Review of the MIP images confirms the above findings IMPRESSION: 1. Abrupt occlusion of a distal right M4 branch, likely embolic. 2. Atheromatous plaque about the carotid bifurcations bilaterally, with associated stenoses of up to 50% on the right and 70% on the left. 3. Aberrant right subclavian artery. 4. Large layering bilateral pleural effusions with probable pulmonary interstitial edema, partially visualized. 5. Enlarged 1.7 cm mediastinal prevascular node, indeterminate, but could be reactive. Electronically Signed   By: Rise Mu M.D.   On: 03/26/2019 03:32   Mr Brain Wo Contrast  Result Date: 03/26/2019 CLINICAL DATA:  Slurred speech and left-sided facial droop EXAM: MRI HEAD WITHOUT CONTRAST TECHNIQUE: Multiplanar, multiecho pulse sequences of the brain and surrounding structures were obtained without intravenous contrast. COMPARISON:  CTA head neck from earlier today FINDINGS: Brain: Moderate area of restricted diffusion in the lateral right frontal lobe primarily involving cortex but also nearly reaching the right lateral ventricle. No superimposed hemorrhage. There is a background of  mild-to-moderate chronic small vessel ischemia in the cerebral white matter for age. Remote lacunar infarct in the left paramedian pons. Age congruent cerebral volume loss. No hydrocephalus, collection, or masslike finding Vascular: Normal flow voids Skull and upper cervical spine: Negative for marrow lesion Sinuses/Orbits: Negative IMPRESSION: 1. Moderate to large acute infarct in the right lateral frontal lobe. 2. Mild-to-moderate chronic small vessel ischemia for age. Electronically Signed   By: Marnee Spring M.D.   On: 03/26/2019 07:56   US Carotid Bilateral (at [facility] And Ap Only)  Result Date: 03/26/2019 CLINICAL DATA:  85 year old female with stroke-like symptoms EXAM: BILATERAL CAROTID DUPLEX ULTRASOUND TECHNIQUE: Wallace Cullens scale imaging, color Doppler and duplex ultrasound were performed of bilateral carotid and vertebral arteries in the neck. COMPARISON:  None. FINDINGS: Criteria: Quantification of carotid stenosis is based on velocity parameters that correlate the residual internal carotid diameter with NASCET-based stenosis levels, using the diameter of the distal internal carotid lumen as the denominator for stenosis measurement. The following velocity measurements were obtained: RIGHT ICA: 86/19 cm/sec CCA: 64/16 cm/sec SYSTOLIC ICA/CCA RATIO:  1.35 ECA:  108 cm/sec LEFT ICA: 129/23 cm/sec CCA: 76/11 cm/sec SYSTOLIC ICA/CCA RATIO:  1.7 ECA:  155 cm/sec RIGHT CAROTID ARTERY: Moderate heterogeneous atherosclerotic plaque in the proximal internal carotid artery. By peak systolic velocity criteria, the estimated stenosis remains less than 50%. RIGHT VERTEBRAL ARTERY:  Patent with normal antegrade flow. LEFT CAROTID ARTERY: Moderate heterogeneous atherosclerotic plaque in the proximal internal carotid artery. By peak systolic velocity criteria, the estimated stenosis remains less than 50%. LEFT VERTEBRAL ARTERY:  Patent with normal antegrade flow. IMPRESSION: 1. Mild (1-49%) stenosis proximal right internal  carotid artery secondary to moderate focal heterogeneous atherosclerotic plaque. 2. Mild (1-49%) stenosis proximal left internal carotid artery secondary to moderate focal heterogeneous atherosclerotic plaque. 3. The vertebral arteries are patent with normal antegrade flow. Signed, Sterling Big, MD, RPVI [facility] Electronically Signed   By: Malachy Moan M.D.   On: 03/26/2019 10:27   Dg Chest Portable 1 View  Result Date: 03/25/2019 CLINICAL DATA:  Shortness of breath EXAM: PORTABLE CHEST 1 VIEW COMPARISON:  07/28/2008 FINDINGS:  Cardiomegaly. Diffuse bilateral interstitial pulmonary opacity and layering bilateral pleural effusions. The visualized skeletal structures are unremarkable. IMPRESSION: 1.  Cardiomegaly. 2. Diffuse bilateral interstitial pulmonary opacity and layering bilateral pleural effusions, most consistent with edema. Electronically Signed   By: Lauralyn Primes M.D.   On: 03/25/2019 21:18   Dg Swallowing Func-speech Pathology  Result Date: 03/27/2019 Objective Swallowing Evaluation: Type of Study: MBS-Modified Barium Swallow Study  Patient Details Name: Becky Johnson MRN: [id number] Date of Birth: Sep 05, 1927 Today's Date: 03/27/2019 Time: SLP Start Time (ACUTE ONLY): 1138 -SLP Stop Time (ACUTE ONLY): 1210 SLP Time Calculation (min) (ACUTE ONLY): 32 min Past Medical History: Past Medical History: Diagnosis Date  Essential hypertension   Hypothyroidism  Past Surgical History: No past surgical history on file. HPI: Keshonna Valvo  is a 85 y.o. female, with history of hypertension, hypothyroidism was brought to the ED by private vehicle with acute onset slurred speech and left facial droop.  Patient says that she was last normal at 2:30 PM she called her nephew who ultimately saw her and transported her to ER, code stroke was activated from triage. In the ED tele neurology was consulted, patient was not found to  be a TPA candidate as she was out of window for TPA.  Patient was found to be in A. fib with RVR, no anticoagulation recommended due to stroke.  CT head was negative for acute abnormality. MRI shows: Moderate to large acute infarct in the right lateral frontal lobe. Pt failed [employer] screen. BSE and SLE ordered  Subjective: "I live by myself." Assessment / Plan / Recommendation CHL IP CLINICAL IMPRESSIONS 03/27/2019 Clinical Impression Pt presents with moderate oropharyngeal phase dysphagia in setting of acute CVA characterized by reduced lingual strength and sensation resulting in lingual pumping, reduced bolus cohesiveness, and premature spillage with liquids; pharyngeal phase is marked by delay in swallow initiation, reduced tongue base retraction and epiglottic deflection, and reduced laryngeal closure resulting in penetration and aspiration of tsp thins (between arytenoids down posterior tracheal wall) and tsp/cup sips NTL with delayed and ineffective cough response (aspirate was not removed). Pt with min vallecular residue which necessitated repeat/dry swallows to clear. Recommend D1/puree and HTL via tsp or cup sips with 100% supervision, encourage small bites,sips and throat clearing periodically, po meds whole in puree. Suspect prognosis for upgrades are good with improved Pt strength and time. Pt is at risk for aspiration given the above deficits and would benefit from f/u dysphagia intervention in acute and SNF with controlled trials of NTL and mech soft. Above to RN. SLP will follow in acute.  SLP Visit Diagnosis Dysphagia, oropharyngeal phase (R13.12) Attention and concentration deficit following -- Frontal lobe and executive function deficit following -- Impact on safety and function Moderate aspiration risk;Risk for inadequate nutrition/hydration   CHL IP TREATMENT RECOMMENDATION 03/27/2019 Treatment Recommendations Therapy as outlined in treatment plan below   Prognosis 03/27/2019 Prognosis for  Safe Diet Advancement Fair Barriers to Reach Goals Severity of deficits Barriers/Prognosis Comment -- CHL IP DIET RECOMMENDATION 03/27/2019 SLP Diet Recommendations Dysphagia 1 (Puree) solids;Honey thick liquids Liquid Administration via Cup Medication Administration Whole meds with puree Compensations Slow rate;Small sips/bites;Multiple dry swallows after each bite/sip;Clear throat intermittently Postural Changes Remain semi-upright after after feeds/meals (Comment);Seated upright at 90 degrees   CHL IP OTHER RECOMMENDATIONS 03/27/2019 Recommended Consults -- Oral Care Recommendations Oral care BID;Staff/trained caregiver to provide oral care Other Recommendations Order thickener from pharmacy;Prohibited food (jello, ice cream, thin soups);Clarify dietary restrictions   CHL  IP FOLLOW UP RECOMMENDATIONS 03/27/2019 Follow up Recommendations Skilled Nursing facility   [facility] IP FREQUENCY AND DURATION 03/27/2019 Speech Therapy Frequency (ACUTE ONLY) min 2x/week Treatment Duration 1 week      CHL IP ORAL PHASE 03/27/2019 Oral Phase Impaired Oral - Pudding Teaspoon -- Oral - Pudding Cup -- Oral - Honey Teaspoon Decreased bolus cohesion;Lingual/palatal residue Oral - Honey Cup Premature spillage;Piecemeal swallowing;Decreased bolus cohesion Oral - Nectar Teaspoon Premature spillage;Decreased bolus cohesion;Lingual/palatal residue Oral - Nectar Cup Premature spillage;Decreased bolus cohesion;Lingual/palatal residue;Piecemeal swallowing;Lingual pumping Oral - Nectar Straw -- Oral - Thin Teaspoon Premature spillage;Decreased bolus cohesion;Lingual pumping Oral - Thin Cup -- Oral - Thin Straw -- Oral - Puree Lingual pumping;Reduced posterior propulsion;Piecemeal swallowing;Delayed oral transit;Decreased bolus cohesion Oral - Mech Soft Lingual pumping;Weak lingual manipulation;Piecemeal swallowing;Delayed oral transit;Decreased bolus cohesion Oral - Regular -- Oral - Multi-Consistency -- Oral - Pill WFL Oral Phase - Comment --   CHL IP PHARYNGEAL PHASE 03/27/2019 Pharyngeal Phase Impaired Pharyngeal- Pudding Teaspoon -- Pharyngeal -- Pharyngeal- Pudding Cup -- Pharyngeal -- Pharyngeal- Honey Teaspoon Delayed swallow initiation-pyriform sinuses;Delayed swallow initiation-vallecula;Reduced epiglottic inversion;Reduced tongue base retraction;Pharyngeal residue - valleculae;Pharyngeal residue - pyriform Pharyngeal -- Pharyngeal- Honey Cup Delayed swallow initiation-pyriform sinuses;Reduced tongue base retraction;Pharyngeal residue - valleculae;Pharyngeal residue - pyriform Pharyngeal -- Pharyngeal- Nectar Teaspoon Delayed swallow initiation-vallecula;Reduced epiglottic inversion;Reduced tongue base retraction;Pharyngeal residue - valleculae;Pharyngeal residue - pyriform Pharyngeal -- Pharyngeal- Nectar Cup Delayed swallow initiation-pyriform sinuses;Reduced airway/laryngeal closure;Reduced epiglottic inversion;Reduced tongue base retraction;Penetration/Aspiration before swallow;Penetration/Aspiration during swallow;Trace aspiration;Moderate aspiration;Pharyngeal residue - valleculae;Pharyngeal residue - pyriform Pharyngeal Material enters airway, passes BELOW cords without attempt by patient to eject out (silent aspiration) Pharyngeal- Nectar Straw -- Pharyngeal -- Pharyngeal- Thin Teaspoon Delayed swallow initiation-pyriform sinuses;Reduced epiglottic inversion;Reduced airway/laryngeal closure;Reduced tongue base retraction;Penetration/Aspiration before swallow;Penetration/Aspiration during swallow;Trace aspiration;Pharyngeal residue - valleculae;Pharyngeal residue - pyriform Pharyngeal Material enters airway, passes BELOW cords and not ejected out despite cough attempt by patient Pharyngeal- Thin Cup -- Pharyngeal -- Pharyngeal- Thin Straw -- Pharyngeal -- Pharyngeal- Puree Delayed swallow initiation-vallecula;Reduced epiglottic inversion;Reduced tongue base retraction;Pharyngeal residue - valleculae Pharyngeal -- Pharyngeal- Mechanical Soft  Reduced epiglottic inversion;Reduced tongue base retraction;Pharyngeal residue - valleculae Pharyngeal -- Pharyngeal- Regular -- Pharyngeal -- Pharyngeal- Multi-consistency -- Pharyngeal -- Pharyngeal- Pill WFL Pharyngeal -- Pharyngeal Comment Head turn L was ineffective  CHL IP CERVICAL ESOPHAGEAL PHASE 03/27/2019 Cervical Esophageal Phase WFL Pudding Teaspoon -- Pudding Cup -- Honey Teaspoon -- Honey Cup -- Nectar Teaspoon -- Nectar Cup -- Nectar Straw -- Thin Teaspoon -- Thin Cup -- Thin Straw -- Puree -- Mechanical Soft -- Regular -- Multi-consistency -- Pill -- Cervical Esophageal Comment -- Thank you, Becky Johnson, CCC-SLP [phone] PORTER,DABNEY 03/27/2019, 1:07 PM              Ct Head Code Stroke Wo Contrast  Result Date: 03/25/2019 CLINICAL DATA:  Code stroke. Focal neuro deficit. Left facial droop today EXAM: CT HEAD WITHOUT CONTRAST TECHNIQUE: Contiguous axial images were obtained from the base of the skull through the vertex without intravenous contrast. COMPARISON:  CT head 07/28/2008 FINDINGS: Brain: Negative for acute cortical infarct. Negative for acute hemorrhage or mass. Progressive atrophy. Progressive changes throughout the white matter bilaterally which are symmetric and appear chronic. Vascular: Negative for hyperdense vessel Skull: Negative Sinuses/Orbits: Negative Other: None ASPECTS (Alberta Stroke Program Early CT Score) - Ganglionic level infarction (caudate, lentiform nuclei, internal capsule, insula, M1-M3 cortex): 7 - Supraganglionic infarction (M4-M6 cortex): 3 Total score (0-10 with 10 being normal): 10 IMPRESSION: 1. No acute abnormality. 2. ASPECTS is  10 3. Progression of atrophy and white matter ischemia since 07/28/2008 4. These results were called by telephone at the time of interpretation on 03/25/2019 at 6:34 pm to provider JOSHUA LONG , who verbally acknowledged these results. Electronically Signed   By: Marlan Palau M.D.   On: 03/25/2019 18:34         Discharge Exam: Vitals:   03/29/19 0755 03/29/19 0800  BP:  134/86  Pulse:  62  Resp:  (!) 9  Temp: 98.5 F (36.9 C)   SpO2:  96%   Vitals:   03/29/19 0400 03/29/19 0500 03/29/19 0755 03/29/19 0800  BP: 123/64 (!) 154/67  134/86  Pulse: 63 62  62  Resp: 11 14  (!) 9  Temp: 98.5 F (36.9 C)  98.5 F (36.9 C)   TempSrc: Oral  Oral   SpO2: 97% 96%  96%  Weight:  70.1 kg    Height:        General: Pt is alert, awake, not in acute distress Cardiovascular: RRR, S1/S2 +, no rubs, no gallops Respiratory: bibasilar crackles, no wheeze Abdominal: Soft, NT, ND, bowel sounds + Extremities: no edema, no cyanosis   The results of significant diagnostics from this hospitalization (including imaging, microbiology, ancillary and laboratory) are listed below for reference.    Significant Diagnostic Studies: Ct Angio Head W Or Wo Contrast  Result Date: 03/26/2019 CLINICAL DATA:  Initial evaluation for acute slurred speech, left-sided facial droop. EXAM: CT ANGIOGRAPHY HEAD AND NECK TECHNIQUE: Multidetector CT imaging of the head and neck was performed using the standard protocol during bolus administration of intravenous contrast. Multiplanar CT image reconstructions and MIPs were obtained to evaluate the vascular anatomy. Carotid stenosis measurements (when applicable) are obtained utilizing NASCET criteria, using the distal internal carotid diameter as the denominator. CONTRAST:  75mL OMNIPAQUE IOHEXOL 350 MG/ML SOLN COMPARISON:  Prior head CT from 03/25/2019. FINDINGS: CTA NECK FINDINGS Aortic arch: Visualized aortic arch of normal caliber. Aberrant right subclavian artery noted. No hemodynamically significant stenosis seen about the origin of the great vessels. Right carotid system: Right CCA widely patent from its origin to the bifurcation without stenosis. Bulky plaque about the right bifurcation/proximal right ICA with associated stenosis of up to 50% by NASCET criteria. Right  ICA widely patent distally to the skull base without stenosis, dissection, or occlusion. Left carotid system: Left CCA widely patent from its origin to the bifurcation. Bulky plaque about the left bifurcation/proximal left ICA with associated stenosis of up to 70% by NASCET criteria. Left ICA widely patent distally to the skull base without stenosis, dissection, or occlusion. Vertebral arteries: Both vertebral arteries arise from the subclavian arteries. Vertebral arteries widely patent within the neck without stenosis dissection or occlusion. Skeleton: No acute osseous finding. No discrete lytic or blastic osseous lesions. Mild cervical spondylosis at C5-6 and C6-7. Other neck: No other acute soft tissue abnormality within the neck. Thyroid is absent. No mass lesion or adenopathy. Upper chest: Enlarged prevascular node measures up to 1.7 cm, indeterminate. Large layering bilateral pleural effusions partially visualized. Associated dependent atelectasis. Hazy ground-glass opacity within the visualized lungs likely reflects a degree of pulmonary edema. Review of the MIP images confirms the above findings CTA HEAD FINDINGS Anterior circulation: Petrous segments widely patent bilaterally. Scattered atheromatous plaque within the cavernous/supraclinoid ICAs with associated mild multifocal narrowing. No hemodynamically significant stenosis. ICA termini well perfused. A1 segments widely patent. Normal anterior communicating artery complex. Anterior cerebral arteries widely patent to their distal aspects without stenosis.  M1 segments widely patent bilaterally. Normal MCA bifurcations. On the right, there is abrupt occlusion of a distal right M4 branch (series 7, image 73), likely embolic in nature. Distal MCA branches otherwise well perfused bilaterally and fairly symmetric. Posterior circulation: Vertebral arteries widely patent to the vertebrobasilar junction without stenosis. Posterior inferior cerebral arteries patent  bilaterally. Basilar widely patent to its distal aspect without stenosis. Superior cerebral arteries patent bilaterally. Both of the posterior cerebral arteries primarily supplied via the basilar. Scattered atheromatous change with associated mild to moderate multifocal stenoses noted within the P2 segments bilaterally. PCAs are perfused to their distal aspects. Venous sinuses: Patent. Anatomic variants: None significant. Review of the MIP images confirms the above findings IMPRESSION: 1. Abrupt occlusion of a distal right M4 branch, likely embolic. 2. Atheromatous plaque about the carotid bifurcations bilaterally, with associated stenoses of up to 50% on the right and 70% on the left. 3. Aberrant right subclavian artery. 4. Large layering bilateral pleural effusions with probable pulmonary interstitial edema, partially visualized. 5. Enlarged 1.7 cm mediastinal prevascular node, indeterminate, but could be reactive. Electronically Signed   By: Rise Mu M.D.   On: 03/26/2019 03:32   Ct Angio Neck W Or Wo Contrast  Result Date: 03/26/2019 CLINICAL DATA:  Initial evaluation for acute slurred speech, left-sided facial droop. EXAM: CT ANGIOGRAPHY HEAD AND NECK TECHNIQUE: Multidetector CT imaging of the head and neck was performed using the standard protocol during bolus administration of intravenous contrast. Multiplanar CT image reconstructions and MIPs were obtained to evaluate the vascular anatomy. Carotid stenosis measurements (when applicable) are obtained utilizing NASCET criteria, using the distal internal carotid diameter as the denominator. CONTRAST:  75mL OMNIPAQUE IOHEXOL 350 MG/ML SOLN COMPARISON:  Prior head CT from 03/25/2019. FINDINGS: CTA NECK FINDINGS Aortic arch: Visualized aortic arch of normal caliber. Aberrant right subclavian artery noted. No hemodynamically significant stenosis seen about the origin of the great vessels. Right carotid system: Right CCA widely patent from its  origin to the bifurcation without stenosis. Bulky plaque about the right bifurcation/proximal right ICA with associated stenosis of up to 50% by NASCET criteria. Right ICA widely patent distally to the skull base without stenosis, dissection, or occlusion. Left carotid system: Left CCA widely patent from its origin to the bifurcation. Bulky plaque about the left bifurcation/proximal left ICA with associated stenosis of up to 70% by NASCET criteria. Left ICA widely patent distally to the skull base without stenosis, dissection, or occlusion. Vertebral arteries: Both vertebral arteries arise from the subclavian arteries. Vertebral arteries widely patent within the neck without stenosis dissection or occlusion. Skeleton: No acute osseous finding. No discrete lytic or blastic osseous lesions. Mild cervical spondylosis at C5-6 and C6-7. Other neck: No other acute soft tissue abnormality within the neck. Thyroid is absent. No mass lesion or adenopathy. Upper chest: Enlarged prevascular node measures up to 1.7 cm, indeterminate. Large layering bilateral pleural effusions partially visualized. Associated dependent atelectasis. Hazy ground-glass opacity within the visualized lungs likely reflects a degree of pulmonary edema. Review of the MIP images confirms the above findings CTA HEAD FINDINGS Anterior circulation: Petrous segments widely patent bilaterally. Scattered atheromatous plaque within the cavernous/supraclinoid ICAs with associated mild multifocal narrowing. No hemodynamically significant stenosis. ICA termini well perfused. A1 segments widely patent. Normal anterior communicating artery complex. Anterior cerebral arteries widely patent to their distal aspects without stenosis. M1 segments widely patent bilaterally. Normal MCA bifurcations. On the right, there is abrupt occlusion of a distal right M4 branch (series 7, image  73), likely embolic in nature. Distal MCA branches otherwise well perfused bilaterally and  fairly symmetric. Posterior circulation: Vertebral arteries widely patent to the vertebrobasilar junction without stenosis. Posterior inferior cerebral arteries patent bilaterally. Basilar widely patent to its distal aspect without stenosis. Superior cerebral arteries patent bilaterally. Both of the posterior cerebral arteries primarily supplied via the basilar. Scattered atheromatous change with associated mild to moderate multifocal stenoses noted within the P2 segments bilaterally. PCAs are perfused to their distal aspects. Venous sinuses: Patent. Anatomic variants: None significant. Review of the MIP images confirms the above findings IMPRESSION: 1. Abrupt occlusion of a distal right M4 branch, likely embolic. 2. Atheromatous plaque about the carotid bifurcations bilaterally, with associated stenoses of up to 50% on the right and 70% on the left. 3. Aberrant right subclavian artery. 4. Large layering bilateral pleural effusions with probable pulmonary interstitial edema, partially visualized. 5. Enlarged 1.7 cm mediastinal prevascular node, indeterminate, but could be reactive. Electronically Signed   By: Rise Mu M.D.   On: 03/26/2019 03:32   Mr Brain Wo Contrast  Result Date: 03/26/2019 CLINICAL DATA:  Slurred speech and left-sided facial droop EXAM: MRI HEAD WITHOUT CONTRAST TECHNIQUE: Multiplanar, multiecho pulse sequences of the brain and surrounding structures were obtained without intravenous contrast. COMPARISON:  CTA head neck from earlier today FINDINGS: Brain: Moderate area of restricted diffusion in the lateral right frontal lobe primarily involving cortex but also nearly reaching the right lateral ventricle. No superimposed hemorrhage. There is a background of mild-to-moderate chronic small vessel ischemia in the cerebral white matter for age. Remote lacunar infarct in the left paramedian pons. Age congruent cerebral volume loss. No hydrocephalus, collection, or masslike finding  Vascular: Normal flow voids Skull and upper cervical spine: Negative for marrow lesion Sinuses/Orbits: Negative IMPRESSION: 1. Moderate to large acute infarct in the right lateral frontal lobe. 2. Mild-to-moderate chronic small vessel ischemia for age. Electronically Signed   By: Marnee Spring M.D.   On: 03/26/2019 07:56   US Carotid Bilateral (at [facility] And [facility] Only)  Result Date: 03/26/2019 CLINICAL DATA:  85 year old female with stroke-like symptoms EXAM: BILATERAL CAROTID DUPLEX ULTRASOUND TECHNIQUE: Wallace Cullens scale imaging, color Doppler and duplex ultrasound were performed of bilateral carotid and vertebral arteries in the neck. COMPARISON:  None. FINDINGS: Criteria: Quantification of carotid stenosis is based on velocity parameters that correlate the residual internal carotid diameter with NASCET-based stenosis levels, using the diameter of the distal internal carotid lumen as the denominator for stenosis measurement. The following velocity measurements were obtained: RIGHT ICA: 86/19 cm/sec CCA: 64/16 cm/sec SYSTOLIC ICA/CCA RATIO:  1.35 ECA:  108 cm/sec LEFT ICA: 129/23 cm/sec CCA: 76/11 cm/sec SYSTOLIC ICA/CCA RATIO:  1.7 ECA:  155 cm/sec RIGHT CAROTID ARTERY: Moderate heterogeneous atherosclerotic plaque in the proximal internal carotid artery. By peak systolic velocity criteria, the estimated stenosis remains less than 50%. RIGHT VERTEBRAL ARTERY:  Patent with normal antegrade flow. LEFT CAROTID ARTERY: Moderate heterogeneous atherosclerotic plaque in the proximal internal carotid artery. By peak systolic velocity criteria, the estimated stenosis remains less than 50%. LEFT VERTEBRAL ARTERY:  Patent with normal antegrade flow. IMPRESSION: 1. Mild (1-49%) stenosis proximal right internal carotid artery secondary to moderate focal heterogeneous atherosclerotic plaque. 2. Mild (1-49%) stenosis proximal left internal carotid artery secondary to moderate focal heterogeneous atherosclerotic plaque. 3. The  vertebral arteries are patent with normal antegrade flow. Signed, Sterling Big, MD, RPVI [facility] Electronically Signed   By: Isac Caddy.D.  On: 03/26/2019 10:27   Dg Chest Portable 1 View  Result Date: 03/25/2019 CLINICAL DATA:  Shortness of breath EXAM: PORTABLE CHEST 1 VIEW COMPARISON:  07/28/2008 FINDINGS: Cardiomegaly. Diffuse bilateral interstitial pulmonary opacity and layering bilateral pleural effusions. The visualized skeletal structures are unremarkable. IMPRESSION: 1.  Cardiomegaly. 2. Diffuse bilateral interstitial pulmonary opacity and layering bilateral pleural effusions, most consistent with edema. Electronically Signed   By: Lauralyn Primes M.D.   On: 03/25/2019 21:18   Dg Swallowing Func-speech Pathology  Result Date: 03/27/2019 Objective Swallowing Evaluation: Type of Study: MBS-Modified Barium Swallow Study  Patient Details Name: SHALEA TOMCZAK MRN: [id number] Date of Birth: 11-Mar-1928 Today's Date: 03/27/2019 Time: SLP Start Time (ACUTE ONLY): 1138 -SLP Stop Time (ACUTE ONLY): 1210 SLP Time Calculation (min) (ACUTE ONLY): 32 min Past Medical History: Past Medical History: Diagnosis Date  Essential hypertension   Hypothyroidism  Past Surgical History: No past surgical history on file. HPI: Becky Johnson  is a 85 y.o. female, with history of hypertension, hypothyroidism was brought to the ED by private vehicle with acute onset slurred speech and left facial droop.  Patient says that she was last normal at 2:30 PM she called her nephew who ultimately saw her and transported her to ER, code stroke was activated from triage. In the ED tele neurology was consulted, patient was not found to be a TPA candidate as she was out of window for TPA.  Patient was found to be in A. fib with RVR, no anticoagulation recommended due to stroke.  CT head was negative for acute abnormality. MRI shows: Moderate to large  acute infarct in the right lateral frontal lobe. Pt failed [employer] screen. BSE and SLE ordered  Subjective: "I live by myself." Assessment / Plan / Recommendation CHL IP CLINICAL IMPRESSIONS 03/27/2019 Clinical Impression Pt presents with moderate oropharyngeal phase dysphagia in setting of acute CVA characterized by reduced lingual strength and sensation resulting in lingual pumping, reduced bolus cohesiveness, and premature spillage with liquids; pharyngeal phase is marked by delay in swallow initiation, reduced tongue base retraction and epiglottic deflection, and reduced laryngeal closure resulting in penetration and aspiration of tsp thins (between arytenoids down posterior tracheal wall) and tsp/cup sips NTL with delayed and ineffective cough response (aspirate was not removed). Pt with min vallecular residue which necessitated repeat/dry swallows to clear. Recommend D1/puree and HTL via tsp or cup sips with 100% supervision, encourage small bites,sips and throat clearing periodically, po meds whole in puree. Suspect prognosis for upgrades are good with improved Pt strength and time. Pt is at risk for aspiration given the above deficits and would benefit from f/u dysphagia intervention in acute and SNF with controlled trials of NTL and mech soft. Above to RN. SLP will follow in acute.  SLP Visit Diagnosis Dysphagia, oropharyngeal phase (R13.12) Attention and concentration deficit following -- Frontal lobe and executive function deficit following -- Impact on safety and function Moderate aspiration risk;Risk for inadequate nutrition/hydration   CHL IP TREATMENT RECOMMENDATION 03/27/2019 Treatment Recommendations Therapy as outlined in treatment plan below   Prognosis 03/27/2019 Prognosis for Safe Diet Advancement Fair Barriers to Reach Goals Severity of deficits Barriers/Prognosis Comment -- CHL IP DIET RECOMMENDATION 03/27/2019 SLP Diet Recommendations Dysphagia 1 (Puree) solids;Honey thick liquids Liquid  Administration via Cup Medication Administration Whole meds with puree Compensations Slow rate;Small sips/bites;Multiple dry swallows after each bite/sip;Clear throat intermittently Postural Changes Remain semi-upright after after feeds/meals (Comment);Seated upright at 90 degrees   CHL IP OTHER RECOMMENDATIONS 03/27/2019 Recommended Consults --  Oral Care Recommendations Oral care BID;Staff/trained caregiver to provide oral care Other Recommendations Order thickener from pharmacy;Prohibited food (jello, ice cream, thin soups);Clarify dietary restrictions   CHL IP FOLLOW UP RECOMMENDATIONS 03/27/2019 Follow up Recommendations [facility] D[facility] 03/27/2019 Speech Therapy Frequency (ACUTE ONLY) min 2x/week Treatment Duration 1 week      CHL IP ORAL PHASE 03/27/2019 Oral Phase Impaired Oral - Pudding Teaspoon -- Oral - Pudding Cup -- Oral - Honey Teaspoon Decreased bolus cohesion;Lingual/palatal residue Oral - Honey Cup Premature spillage;Piecemeal swallowing;Decreased bolus cohesion Oral - Nectar Teaspoon Premature spillage;Decreased bolus cohesion;Lingual/palatal residue Oral - Nectar Cup Premature spillage;Decreased bolus cohesion;Lingual/palatal residue;Piecemeal swallowing;Lingual pumping Oral - Nectar Straw -- Oral - Thin Teaspoon Premature spillage;Decreased bolus cohesion;Lingual pumping Oral - Thin Cup -- Oral - Thin Straw -- Oral - Puree Lingual pumping;Reduced posterior propulsion;Piecemeal swallowing;Delayed oral transit;Decreased bolus cohesion Oral - Mech Soft Lingual pumping;Weak lingual manipulation;Piecemeal swallowing;Delayed oral transit;Decreased bolus cohesion Oral - Regular -- Oral - Multi-Consistency -- Oral - Pill WFL Oral Phase - Comment --  CHL IP PHARYNGEAL PHASE 03/27/2019 Pharyngeal Phase Impaired Pharyngeal- Pudding Teaspoon -- Pharyngeal -- Pharyngeal- Pudding Cup -- Pharyngeal -- Pharyngeal- Honey Teaspoon Delayed swallow initiation-pyriform  sinuses;Delayed swallow initiation-vallecula;Reduced epiglottic inversion;Reduced tongue base retraction;Pharyngeal residue - valleculae;Pharyngeal residue - pyriform Pharyngeal -- Pharyngeal- Honey Cup Delayed swallow initiation-pyriform sinuses;Reduced tongue base retraction;Pharyngeal residue - valleculae;Pharyngeal residue - pyriform Pharyngeal -- Pharyngeal- Nectar Teaspoon Delayed swallow initiation-vallecula;Reduced epiglottic inversion;Reduced tongue base retraction;Pharyngeal residue - valleculae;Pharyngeal residue - pyriform Pharyngeal -- Pharyngeal- Nectar Cup Delayed swallow initiation-pyriform sinuses;Reduced airway/laryngeal closure;Reduced epiglottic inversion;Reduced tongue base retraction;Penetration/Aspiration before swallow;Penetration/Aspiration during swallow;Trace aspiration;Moderate aspiration;Pharyngeal residue - valleculae;Pharyngeal residue - pyriform Pharyngeal Material enters airway, passes BELOW cords without attempt by patient to eject out (silent aspiration) Pharyngeal- Nectar Straw -- Pharyngeal -- Pharyngeal- Thin Teaspoon Delayed swallow initiation-pyriform sinuses;Reduced epiglottic inversion;Reduced airway/laryngeal closure;Reduced tongue base retraction;Penetration/Aspiration before swallow;Penetration/Aspiration during swallow;Trace aspiration;Pharyngeal residue - valleculae;Pharyngeal residue - pyriform Pharyngeal Material enters airway, passes BELOW cords and not ejected out despite cough attempt by patient Pharyngeal- Thin Cup -- Pharyngeal -- Pharyngeal- Thin Straw -- Pharyngeal -- Pharyngeal- Puree Delayed swallow initiation-vallecula;Reduced epiglottic inversion;Reduced tongue base retraction;Pharyngeal residue - valleculae Pharyngeal -- Pharyngeal- Mechanical Soft Reduced epiglottic inversion;Reduced tongue base retraction;Pharyngeal residue - valleculae Pharyngeal -- Pharyngeal- Regular -- Pharyngeal -- Pharyngeal- Multi-consistency -- Pharyngeal -- Pharyngeal- Pill WFL  Pharyngeal -- Pharyngeal Comment Head turn L was ineffective  CHL IP CERVICAL ESOPHAGEAL PHASE 03/27/2019 Cervical Esophageal Phase WFL Pudding Teaspoon -- Pudding Cup -- Honey Teaspoon -- Honey Cup -- Nectar Teaspoon -- Nectar Cup -- Nectar Straw -- Thin Teaspoon -- Thin Cup -- Thin Straw -- Puree -- Mechanical Soft -- Regular -- Multi-consistency -- Pill -- Cervical Esophageal Comment -- Thank you, Becky MorosDabney Porter, CCC-SLP [phone] PORTER,DABNEY 03/27/2019, 1:07 PM              Ct Head Code Stroke Wo Contrast  Result Date: 03/25/2019 CLINICAL DATA:  Code stroke. Focal neuro deficit. Left facial droop today EXAM: CT HEAD WITHOUT CONTRAST TECHNIQUE: Contiguous axial images were obtained from the base of the skull through the vertex without intravenous contrast. COMPARISON:  CT head 07/28/2008 FINDINGS: Brain: Negative for acute cortical infarct. Negative for acute hemorrhage or mass. Progressive atrophy. Progressive changes throughout the white matter bilaterally which are symmetric and appear chronic. Vascular: Negative for hyperdense vessel Skull: Negative Sinuses/Orbits: Negative Other: None ASPECTS (Alberta Stroke Program Early CT Score) - Ganglionic level infarction (caudate, lentiform nuclei,  internal capsule, insula, M1-M3 cortex): 7 - Supraganglionic infarction (M4-M6 cortex): 3 Total score (0-10 with 10 being normal): 10 IMPRESSION: 1. No acute abnormality. 2. ASPECTS is 10 3. Progression of atrophy and white matter ischemia since 07/28/2008 4. These results were called by telephone at the time of interpretation on 03/25/2019 at 6:34 pm to provider JOSHUA LONG , who verbally acknowledged these results. Electronically Signed   By: Franchot Gallo M.D.   On: 03/25/2019 18:34     Microbiology: Recent Results (from the past 240 hour(s))  SARS CORONAVIRUS 2 (Beverlyn Mcginness 6-24 HRS) Nasopharyngeal Nasopharyngeal Swab     Status: None   Collection Time: 03/25/19  7:38 PM   Specimen: Nasopharyngeal Swab    Result Value Ref Range Status   SARS Coronavirus 2 NEGATIVE NEGATIVE Final    Comment: (NOTE) SARS-CoV-2 target nucleic acids are NOT DETECTED. The SARS-CoV-2 RNA is generally detectable in upper and lower respiratory specimens during the acute phase of infection. Negative results do not preclude SARS-CoV-2 infection, do not rule out co-infections with other pathogens, and should not be used as the sole basis for treatment or other patient management decisions. Negative results must be combined with clinical observations, patient history, and epidemiological information. The expected result is Negative. Fact Sheet for Patients: SugarRoll.be Fact Sheet for Healthcare Providers: https://www.woods-mathews.com/ This test is not yet approved or cleared by the [place] FDA and  has been authorized for detection and/or diagnosis of SARS-CoV-2 by FDA under an Emergency Use Authorization (EUA). This EUA will remain  in effect (meaning this test can be used) for the duration of the COVID-19 declaration under Section 56 4(b)(1) of the Act, 21 U.S.C. section 360bbb-3(b)(1), unless the authorization is terminated or revoked sooner. Performed at [facility], [place] [address] [place], [place] [address]   MRSA PCR Screening     Status: None   Collection Time: 03/26/19 10:49 AM   Specimen: Nasopharyngeal  Result Value Ref Range Status   MRSA by PCR NEGATIVE NEGATIVE Final    Comment:        The GeneXpert MRSA Assay (FDA approved for NASAL specimens only), is one component of a comprehensive MRSA colonization surveillance program. It is not intended to diagnose MRSA infection nor to guide or monitor treatment for MRSA infections. Performed at [facility], [address] [place], [place] [address]   SARS CORONAVIRUS 2 (Becky Johnson 6-24 HRS) Nasopharyngeal Nasopharyngeal Swab     Status: None   Collection Time: 03/28/19  2:44 PM   Specimen:  Nasopharyngeal Swab  Result Value Ref Range Status   SARS Coronavirus 2 NEGATIVE NEGATIVE Final    Comment: (NOTE) SARS-CoV-2 target nucleic acids are NOT DETECTED. The SARS-CoV-2 RNA is generally detectable in upper and lower respiratory specimens during the acute phase of infection. Negative results do not preclude SARS-CoV-2 infection, do not rule out co-infections with other pathogens, and should not be used as the sole basis for treatment or other patient management decisions. Negative results must be combined with clinical observations, patient history, and epidemiological information. The expected result is Negative. Fact Sheet for Patients: SugarRoll.be Fact Sheet for Healthcare Providers: https://www.woods-mathews.com/ This test is not yet approved or cleared by the [place] FDA and  has been authorized for detection and/or diagnosis of SARS-CoV-2 by FDA under an Emergency Use Authorization (EUA). This EUA will remain  in effect (meaning this test can be used) for the duration of the COVID-19 declaration under Section 56 4(b)(1) of the Act,  21 U.S.C. section 360bbb-3(b)(1), unless the authorization is terminated or revoked sooner. Performed at [facility], [address], [place], [place] [address]      Labs: Basic Metabolic Panel: Recent Labs  Lab 03/25/19 1941 03/27/19 0417 03/28/19 0412 03/29/19 0241  NA 136 141 140 138  K 3.6 3.1* 3.4* 3.9  CL 106 108 104 106  CO2 18* GLUCOSE 151* 90 101* 127*  BUN 27*  CREATININE 1.19* 1.18* 1.24* 1.22*  CALCIUM 8.5* 8.3* 8.6* 8.4*  MG  --  1.9 1.9  --    Liver Function Tests: Recent Labs  Lab 03/25/19 1941  AST 23  ALT 18  ALKPHOS 50  BILITOT 0.6  PROT 7.1  ALBUMIN 3.6   No results for input(s): LIPASE, AMYLASE in the last 168 hours. No results for input(s): AMMONIA in the last 168 hours. CBC: Recent Labs  Lab 03/25/19 1941  03/27/19 0417 03/28/19 0412 03/29/19 0241  WBC 9.1 7.9 10.6* 9.6  NEUTROABS 7.9*  --   --   --   HGB 11.4* 11.4* 11.7* 11.5*  HCT 37.6 38.5 38.4 38.3  MCV 90.2 91.9 89.9 89.9  PLT 241 246 277 274   Cardiac Enzymes: No results for input(s): CKTOTAL, CKMB, CKMBINDEX, TROPONINI in the last 168 hours. BNP: Invalid input(s): POCBNP CBG: Recent Labs  Lab 03/25/19 1821  GLUCAP 172*    Time coordinating discharge:  36 minutes  Signed:  Catarina Hartshorn, DO [facility] Pager: [phone] 03/29/2019, 11:04 AM

## 2019-03-29 NOTE — Progress Notes (Signed)
Patient is to be discharged home and in stable condition. Patient made aware of discharge and agreeable. Report called to [employer], LPN at [facility]. All questions addressed and answered. Patient to be transported by EMS upon their arrival.  Celestia Khat, RN

## 2019-04-12 ENCOUNTER — Telehealth: Payer: Medicare Other | Admitting: Student

## 2019-04-17 ENCOUNTER — Telehealth (INDEPENDENT_AMBULATORY_CARE_PROVIDER_SITE_OTHER): Payer: Medicare Other | Admitting: Student

## 2019-04-17 ENCOUNTER — Encounter: Payer: Self-pay | Admitting: Student

## 2019-04-17 VITALS — BP 120/60 | HR 90 | Ht 59.0 in

## 2019-04-17 DIAGNOSIS — Z8673 Personal history of transient ischemic attack (TIA), and cerebral infarction without residual deficits: Secondary | ICD-10-CM

## 2019-04-17 DIAGNOSIS — I10 Essential (primary) hypertension: Secondary | ICD-10-CM

## 2019-04-17 DIAGNOSIS — E785 Hyperlipidemia, unspecified: Secondary | ICD-10-CM

## 2019-04-17 DIAGNOSIS — I480 Paroxysmal atrial fibrillation: Secondary | ICD-10-CM

## 2019-04-17 NOTE — Patient Instructions (Signed)
Medication Instructions:  Your physician recommends that you continue on your current medications as directed. Please refer to the Current Medication list given to you today.  *If you need a refill on your cardiac medications before your next appointment, please call your pharmacy*  Lab Work: NONE  If you have labs (blood work) drawn today and your tests are completely normal, you will receive your results only by: Marland Kitchen MyChart Message (if you have MyChart) OR . A paper copy in the mail If you have any lab test that is abnormal or we need to change your treatment, we will call you to review the results.  Testing/Procedures: NONE  Follow-Up: At [facility], you and your health needs are our priority.  As part of our continuing mission to provide you with exceptional heart care, we have created designated Provider Care Teams.  These Care Teams include your primary Cardiologist (physician) and Advanced Practice Providers (APPs -  Physician Assistants and Nurse Practitioners) who all work together to provide you with the care you need, when you need it.  Your next appointment:   3 months  The format for your next appointment:   Either In Person or Virtual  Provider:   Carlyle Dolly, MD  Other Instructions Thank you for choosing [facility]!

## 2019-04-17 NOTE — Progress Notes (Signed)
Virtual Visit via Telephone Note   This visit type was conducted due to national recommendations for restrictions regarding the COVID-19 Pandemic (e.g. social distancing) in an effort to limit this patient's exposure and mitigate transmission in our community.  Due to her co-morbid illnesses, this patient is at least at moderate risk for complications without adequate follow up.  This format is felt to be most appropriate for this patient at this time.  The patient did not have access to video technology/had technical difficulties with video requiring transitioning to audio format only (telephone).  All issues noted in this document were discussed and addressed.  No physical exam could be performed with this format.  Please refer to the patient's chart for her  consent to telehealth for [facility].   Date:  04/17/2019   ID:  Becky Johnson, DOB 1928/03/20, MRN [id number]  Patient Location: Skilled Nursing Facility Provider Location: Office  PCP:  Karleen Hampshire, MD  Cardiologist:  Dina Rich, MD  Electrophysiologist:  None   Evaluation Performed:  Follow-Up Visit  Chief Complaint: Hospital Follow-Up  History of Present Illness:    Becky Johnson is a 85 y.o. female with past medical history of HTN, HLD and hypothyroidism who presents to the office today for hospital follow-up.   She recently presented to [facility] ED on 03/25/2019 for evaluation of slurred speech and left facial droop. CT Angio of the head showed an abrupt occlusion of the distal right M4 branch felt to be embolic and plaque along the carotid bifurcations bilaterally. MRI Brain showed a moderate to large acute infarct in the right lateral frontal lobe and mild to moderate chronic small vessel ischemia.tPA was not administered given that it was unclear the last time she was in her normal state. Cardiology was consulted as she was found to be in new onset atrial fibrillation with RVR upon  admission. She was initially started on IV Cardizem for rate control and converted to PO dosing later during admission. On 03/28/2019 she was found to be back in normal sinus rhythm and it was recommended that her PTA Toprol-XL 50mg  daily be restarted along with Eliquis 5mg  BID for anticoagulation once cleared by Neurology. Neurology did recommend continuing ASA 325 mg daily for 3 days following hospital discharge and then switching to Eliquis 5 mg twice daily.   In talking with the patient today, she was discharged to [facility] for therapy and reports improvement in her weakness (lived by herself prior to admission). She has been working on along her upper and lower extremities but is still wheelchair-bound at this point. She has not been up walking with a a walker as of yet.  She denies any recurrent palpitations. Says that she was aware of her atrial fibrillation during admission. No recent chest pain, dyspnea, orthopnea, PND or lower extremity edema.  She denies any recent melena, hematochezia or hematuria since starting Eliquis.  The patient does not have symptoms concerning for COVID-19 infection (fever, chills, cough, or new shortness of breath).    Past Medical History:  Diagnosis Date   Atrial fibrillation (HCC)    a. diagnosed in 03/2019.   CVA (cerebral vascular accident) [facility])    Essential hypertension    Hypothyroidism    No past surgical history on file.   Current Meds  Medication Sig   acetaminophen (ARTHRITIS PAIN) 650 MG CR tablet Take 650 mg by mouth every 8 (eight) hours as needed for pain.   apixaban (ELIQUIS)  5 MG TABS tablet Take 1 tablet (5 mg total) by mouth 2 (two) times daily. Start 04/02/19   Carboxymethylcellulose Sodium (THERATEARS) 0.25 % SOLN Apply 1-2 drops to eye daily as needed (for irritation/dry eye).   furosemide (LASIX) 20 MG tablet Take 1 tablet (20 mg total) by mouth daily.   levothyroxine (SYNTHROID) 100 MCG tablet Take  100 mcg by mouth every morning.    metoprolol succinate (TOPROL-XL) 25 MG 24 hr tablet Take 3 tablets (75 mg total) by mouth daily.   pantoprazole (PROTONIX) 40 MG tablet Take 40 mg by mouth daily.   simvastatin (ZOCOR) 20 MG tablet Take 20 mg by mouth every morning.      Allergies:   Patient has no known allergies.   Social History   Tobacco Use   Smoking status: Former Smoker   Smokeless tobacco: Never Used  Substance Use Topics   Alcohol use: Not on file   Drug use: Not on file     Family Hx: The patient's family history includes Hypertension in her mother.  ROS:   Please see the history of present illness.     All other systems reviewed and are negative.   Prior CV studies:   The following studies were reviewed today:  Echocardiogram: 03/2019 IMPRESSIONS   1. Left ventricular ejection fraction, by visual estimation, is 60 to 65%. The left ventricle has normal function. There is mildly increased left ventricular hypertrophy.  2. Left ventricular diastolic Doppler parameters are indeterminate pattern of LV diastolic filling.  3. Global right ventricle has normal systolic function.The right ventricular size is normal. No increase in right ventricular wall thickness.  4. Left atrial size was normal.  5. Right atrial size was normal.  6. The mitral valve is normal in structure. Mild mitral valve regurgitation. No evidence of mitral stenosis.  7. The tricuspid valve is not well visualized. Tricuspid valve regurgitation is trivial.  8. The aortic valve was not well visualized Aortic valve regurgitation was not visualized by color flow Doppler. Structurally normal aortic valve, with no evidence of sclerosis or stenosis.  9. The pulmonic valve was not well visualized. Pulmonic valve regurgitation is not visualized by color flow Doppler. 10. Moderately elevated pulmonary artery systolic pressure. 11. The inferior vena cava is normal in size with greater than 50%  respiratory variability, suggesting right atrial pressure of 3 mmHg.  Labs/Other Tests and Data Reviewed:    EKG:  An ECG dated 03/25/2019 was personally reviewed today and demonstrated:  atrial fibrillation with RVR, HR 133.  Recent Labs: 03/25/2019: ALT 18; TSH 9.143 03/26/2019: B Natriuretic Peptide 344.0 03/28/2019: Magnesium 1.9 03/29/2019: BUN 27; Creatinine, Ser 1.22; Hemoglobin 11.5; Platelets 274; Potassium 3.9; Sodium 138   Recent Lipid Panel Lab Results  Component Value Date/Time   CHOL 145 03/26/2019 05:15 AM   TRIG 95 03/26/2019 05:15 AM   HDL 57 03/26/2019 05:15 AM   CHOLHDL 2.5 03/26/2019 05:15 AM   LDLCALC 69 03/26/2019 05:15 AM    Wt Readings from Last 3 Encounters:  03/29/19 154 lb 8.7 oz (70.1 kg)     Objective:    Vital Signs:  BP 120/60    Pulse 90    Ht 4\' 11"  (1.499 m)    SpO2 95%    BMI 31.21 kg/m    General: Pleasant female sounding in NAD Psych: Normal affect. Neuro: Alert and oriented X 3.  Lungs:  Resp regular and unlabored while talking on the phone.   ASSESSMENT & PLAN:  1. Paroxysmal Atrial Fibrillation - This was a new diagnosis for the patient in the setting of presenting with an acute CVA.  She had been experiencing palpitations leading up to admission but had not been evaluated. She converted back to NSR during admission and denies any recurrent palpitations since hospital discharge. - Continue Toprol-XL 75 mg daily for rate control. - She denies any evidence of active bleeding. Continue Eliquis 5 mg twice daily for anticoagulation (only indication for reduced dosing at this time is age as weight was 70.1 kg on most recent check and creatinine was 1.22 by most recent labs in 03/2019).  2. HTN - BP was well controlled at 120/60 on most recent check. Continue Toprol-XL 75 mg daily.  3. HLD - FLP during recent admission showed total cholesterol 145, triglycerides 95, HDL 57 and LDL 69. Continue Simvastatin 20 mg daily with goal LDL less  than 70 given recent CVA.  4. History of CVA - MRI Brain during recent admission showed a moderate to large acute infarct in the right lateral frontal lobe and mild to moderate chronic small vessel ischemia. tPA was not administered given unclear time of onset. - She has residual weakness and is undergoing therapy at [facility]. - was only on ASA for 1 week per Neurology given the need for anticoagulation.    COVID-19 Education: The signs and symptoms of COVID-19 were discussed with the patient and how to seek care for testing (follow up with PCP or arrange E-visit).  The importance of social distancing was discussed today.  Time:   Today, I have spent 13 minutes with the patient with telehealth technology discussing the above problems.     Medication Adjustments/Labs and Tests Ordered: Current medicines are reviewed at length with the patient today.  Concerns regarding medicines are outlined above.   Tests Ordered: No orders of the defined types were placed in this encounter.   Medication Changes: No orders of the defined types were placed in this encounter.   Follow Up:  Either In Person or Virtual in 3 month(s)  Signed, Erma Heritage, PA-C  04/17/2019 4:37 PM    [facility]

## 2022-10-05 DEATH — deceased
